# Patient Record
Sex: Female | Born: 1940
Health system: Southern US, Community
[De-identification: ages and names within clinical notes are randomized; demographics above are authoritative.]

## PROBLEM LIST (undated history)

## (undated) DIAGNOSIS — M810 Age-related osteoporosis without current pathological fracture: Secondary | ICD-10-CM

## (undated) DIAGNOSIS — Z923 Personal history of irradiation: Secondary | ICD-10-CM

## (undated) DIAGNOSIS — R011 Cardiac murmur, unspecified: Secondary | ICD-10-CM

## (undated) DIAGNOSIS — K219 Gastro-esophageal reflux disease without esophagitis: Secondary | ICD-10-CM

## (undated) DIAGNOSIS — T7840XA Allergy, unspecified, initial encounter: Secondary | ICD-10-CM

## (undated) DIAGNOSIS — I10 Essential (primary) hypertension: Secondary | ICD-10-CM

## (undated) DIAGNOSIS — M1990 Unspecified osteoarthritis, unspecified site: Secondary | ICD-10-CM

## (undated) DIAGNOSIS — H04123 Dry eye syndrome of bilateral lacrimal glands: Secondary | ICD-10-CM

## (undated) DIAGNOSIS — E785 Hyperlipidemia, unspecified: Secondary | ICD-10-CM

## (undated) DIAGNOSIS — C50919 Malignant neoplasm of unspecified site of unspecified female breast: Secondary | ICD-10-CM

## (undated) HISTORY — DX: Hyperlipidemia, unspecified: E78.5

## (undated) HISTORY — DX: Unspecified osteoarthritis, unspecified site: M19.90

## (undated) HISTORY — PX: EYE SURGERY: SHX253

## (undated) HISTORY — DX: Allergy, unspecified, initial encounter: T78.40XA

## (undated) HISTORY — DX: Dry eye syndrome of bilateral lacrimal glands: H04.123

## (undated) HISTORY — DX: Cardiac murmur, unspecified: R01.1

## (undated) HISTORY — DX: Age-related osteoporosis without current pathological fracture: M81.0

## (undated) HISTORY — DX: Essential (primary) hypertension: I10

## (undated) HISTORY — DX: Gastro-esophageal reflux disease without esophagitis: K21.9

---

## 1998-12-20 DIAGNOSIS — C50919 Malignant neoplasm of unspecified site of unspecified female breast: Secondary | ICD-10-CM

## 1998-12-20 DIAGNOSIS — Z923 Personal history of irradiation: Secondary | ICD-10-CM

## 1998-12-20 HISTORY — PX: BREAST LUMPECTOMY: SHX2

## 1998-12-20 HISTORY — PX: BREAST BIOPSY: SHX20

## 1998-12-20 HISTORY — DX: Malignant neoplasm of unspecified site of unspecified female breast: C50.919

## 1998-12-20 HISTORY — DX: Personal history of irradiation: Z92.3

## 2004-12-26 ENCOUNTER — Emergency Department: Payer: Self-pay | Admitting: Emergency Medicine

## 2005-07-05 LAB — HM COLONOSCOPY: HM Colonoscopy: NORMAL

## 2012-07-05 ENCOUNTER — Encounter: Payer: Self-pay | Admitting: Internal Medicine

## 2012-07-05 ENCOUNTER — Ambulatory Visit (INDEPENDENT_AMBULATORY_CARE_PROVIDER_SITE_OTHER): Payer: Medicare Other | Admitting: Internal Medicine

## 2012-07-05 VITALS — BP 140/90 | HR 76 | Temp 98.7°F | Ht 62.0 in | Wt 162.0 lb

## 2012-07-05 DIAGNOSIS — E785 Hyperlipidemia, unspecified: Secondary | ICD-10-CM | POA: Insufficient documentation

## 2012-07-05 DIAGNOSIS — F329 Major depressive disorder, single episode, unspecified: Secondary | ICD-10-CM

## 2012-07-05 DIAGNOSIS — Z853 Personal history of malignant neoplasm of breast: Secondary | ICD-10-CM | POA: Insufficient documentation

## 2012-07-05 DIAGNOSIS — M810 Age-related osteoporosis without current pathological fracture: Secondary | ICD-10-CM | POA: Insufficient documentation

## 2012-07-05 DIAGNOSIS — F32A Depression, unspecified: Secondary | ICD-10-CM | POA: Insufficient documentation

## 2012-07-05 DIAGNOSIS — F3289 Other specified depressive episodes: Secondary | ICD-10-CM

## 2012-07-05 LAB — COMPREHENSIVE METABOLIC PANEL
ALT: 27 U/L (ref 0–35)
Albumin: 4.3 g/dL (ref 3.5–5.2)
CO2: 29 mEq/L (ref 19–32)
Calcium: 9.6 mg/dL (ref 8.4–10.5)
Chloride: 103 mEq/L (ref 96–112)
GFR: 75.14 mL/min (ref >60.00)
Glucose, Bld: 98 mg/dL (ref 70–99)
Potassium: 3.9 mEq/L (ref 3.5–5.1)
Sodium: 139 mEq/L (ref 135–145)
Total Protein: 7.5 g/dL (ref 6.0–8.3)

## 2012-07-05 LAB — LIPID PANEL: Cholesterol: 207 mg/dL — ABNORMAL HIGH (ref 0–200)

## 2012-07-05 MED ORDER — SIMVASTATIN 20 MG PO TABS
20.0000 mg | ORAL_TABLET | Freq: Every evening | ORAL | Status: DC
Start: 2012-07-05 — End: 2013-07-16

## 2012-07-05 MED ORDER — VENLAFAXINE HCL ER 150 MG PO CP24: 150.0000 mg | ORAL_CAPSULE | Freq: Every day | ORAL | Status: DC

## 2012-07-05 NOTE — Assessment & Plan Note (Signed)
Will obtain reports on previous treatment at [facility]. Repeat Dexa 2014.

## 2012-07-05 NOTE — Progress Notes (Signed)
Subjective:    Patient ID: Stephanie Mora, female    DOB: [birth year], 71 y.o.   MRN: [id number]  HPI 71YO female with h/o right sided breast cancer in 2000, hyperlipidemia, osteoporosis, and depression presents to establish care. She reports she is generally feeling well. She has been exercising, participating in a weight bearing class at [facility] several times per week.  She eats a healthy diet.  She reports full compliance with her medications. In regards to depression, she reports good control of symptoms with use of Effexor, which was started after diagnosis of breast cancer.  She has regular follow up at [facility] for mammograms, which have recently been normal.  She also follows at [facility] for osteoporosis and is scheduled in 2014 for Dexa.  Outpatient Encounter Prescriptions as of 07/05/2012  Medication Sig Dispense Refill  . aspirin 81 MG tablet Take 81 mg by mouth daily.      . calcium carbonate (OS-CAL) 600 MG TABS Take 600 mg by mouth 2 (two) times daily with a meal.      . Cholecalciferol (VITAMIN D3) 2000 UNITS TABS Take 1 tablet by mouth daily.      . Multiple Vitamin (MULTIVITAMIN) tablet Take 1 tablet by mouth daily.      . simvastatin (ZOCOR) 20 MG tablet Take 1 tablet (20 mg total) by mouth every evening.  90 tablet  4  . venlafaxine XR (EFFEXOR-XR) 150 MG 24 hr capsule Take 1 capsule (150 mg total) by mouth daily.  90 capsule  4  . DISCONTD: simvastatin (ZOCOR) 20 MG tablet Take 20 mg by mouth every evening.      Marland Kitchen DISCONTD: venlafaxine XR (EFFEXOR-XR) 150 MG 24 hr capsule Take 150 mg by mouth daily.       BP 140/90  Pulse 76  Temp 98.7 F (37.1 C) (Oral)  Ht 5\' 2"  (1.575 m)  Wt 162 lb (73.483 kg)  BMI 29.63 kg/m2  SpO2 98%  Review of Systems  Constitutional: Negative for fever, chills, appetite change, fatigue and unexpected weight change.  HENT: Negative for ear pain, congestion, sore throat, trouble swallowing, neck pain, voice change and sinus pressure.   Eyes: Negative  for visual disturbance.  Respiratory: Negative for cough, shortness of breath, wheezing and stridor.   Cardiovascular: Negative for chest pain, palpitations and leg swelling.  Gastrointestinal: Negative for nausea, vomiting, abdominal pain, diarrhea, constipation, blood in stool, abdominal distention and anal bleeding.  Genitourinary: Negative for dysuria and flank pain.  Musculoskeletal: Negative for myalgias, arthralgias and gait problem.  Skin: Negative for color change and rash.  Neurological: Negative for dizziness and headaches.  Hematological: Negative for adenopathy. Does not bruise/bleed easily.  Psychiatric/Behavioral: Negative for suicidal ideas, disturbed wake/sleep cycle and dysphoric mood. The patient is not nervous/anxious.        Objective:   Physical Exam  Constitutional: She is oriented to person, place, and time. She appears well-developed and well-nourished. No distress.  HENT:  Head: Normocephalic and atraumatic.  Right Ear: External ear normal.  Left Ear: External ear normal.  Nose: Nose normal.  Mouth/Throat: Oropharynx is clear and moist. No oropharyngeal exudate.  Eyes: Conjunctivae are normal. Pupils are equal, round, and reactive to light. Right eye exhibits no discharge. Left eye exhibits no discharge. No scleral icterus.  Neck: Normal range of motion. Neck supple. No tracheal deviation present. No thyromegaly present.  Cardiovascular: Normal rate, regular rhythm, normal heart sounds and intact distal pulses.  Exam reveals no gallop and no friction  rub.   No murmur heard. Pulmonary/Chest: Effort normal and breath sounds normal. No respiratory distress. She has no wheezes. She has no rales. She exhibits no tenderness.  Musculoskeletal: Normal range of motion. She exhibits no edema and no tenderness.  Lymphadenopathy:    She has no cervical adenopathy.  Neurological: She is alert and oriented to person, place, and time. No cranial nerve deficit. She exhibits  normal muscle tone. Coordination normal.  Skin: Skin is warm and dry. No rash noted. She is not diaphoretic. No erythema. No pallor.  Psychiatric: She has a normal mood and affect. Her behavior is normal. Judgment and thought content normal.          Assessment & Plan:

## 2012-07-05 NOTE — Assessment & Plan Note (Signed)
Well controlled on Effexor. Will continue. Follow up 3 months.

## 2012-07-05 NOTE — Assessment & Plan Note (Signed)
Will get records on previous evaluation and management. 

## 2012-07-05 NOTE — Assessment & Plan Note (Signed)
Will check lipids and LFTs with labs today. Follow up 3 months for physical exam. Continue Simvastatin.

## 2013-04-20 ENCOUNTER — Encounter: Payer: Medicare Other | Admitting: Internal Medicine

## 2013-05-09 ENCOUNTER — Ambulatory Visit (INDEPENDENT_AMBULATORY_CARE_PROVIDER_SITE_OTHER): Payer: Medicare Other | Admitting: Internal Medicine

## 2013-05-09 ENCOUNTER — Encounter: Payer: Self-pay | Admitting: Internal Medicine

## 2013-05-09 VITALS — BP 160/98 | HR 78 | Temp 98.3°F | Ht 62.0 in | Wt 169.0 lb

## 2013-05-09 DIAGNOSIS — R319 Hematuria, unspecified: Secondary | ICD-10-CM

## 2013-05-09 DIAGNOSIS — M6530 Trigger finger, unspecified finger: Secondary | ICD-10-CM | POA: Insufficient documentation

## 2013-05-09 DIAGNOSIS — L608 Other nail disorders: Secondary | ICD-10-CM

## 2013-05-09 DIAGNOSIS — L603 Nail dystrophy: Secondary | ICD-10-CM | POA: Insufficient documentation

## 2013-05-09 DIAGNOSIS — R141 Gas pain: Secondary | ICD-10-CM

## 2013-05-09 DIAGNOSIS — R142 Eructation: Secondary | ICD-10-CM | POA: Insufficient documentation

## 2013-05-09 DIAGNOSIS — Z0000 Encounter for general adult medical examination without abnormal findings: Secondary | ICD-10-CM

## 2013-05-09 DIAGNOSIS — M810 Age-related osteoporosis without current pathological fracture: Secondary | ICD-10-CM

## 2013-05-09 DIAGNOSIS — R143 Flatulence: Secondary | ICD-10-CM

## 2013-05-09 LAB — POCT URINALYSIS DIPSTICK
Protein, UA: NEGATIVE
Spec Grav, UA: 1.025
Urobilinogen, UA: 0.2

## 2013-05-09 MED ORDER — FLUTICASONE PROPIONATE 50 MCG/ACT NA SUSP: 2.0000 | Freq: Every day | NASAL | Status: DC

## 2013-05-09 NOTE — Assessment & Plan Note (Signed)
Discussed new FDA recommendations about discontinuing Calcium supplements and getting adequate calcium through diet.

## 2013-05-09 NOTE — Assessment & Plan Note (Signed)
Symptoms and exam consistent with trigger finger. Discussed referral to orthopedics. Pt would like to hold off for now.

## 2013-05-09 NOTE — Assessment & Plan Note (Signed)
Trace blood noted in urine today, will send urine for culture.

## 2013-05-09 NOTE — Assessment & Plan Note (Signed)
Abdominal pain, distension and belching. Exam is normal. Will check H. Pylori with labs. Consider starting daily PPI if symptoms persistent. Also discussed potential referral for EGD if symptoms persistent.

## 2013-05-09 NOTE — Assessment & Plan Note (Signed)
Recent splitting of nails. Likely normal finding. Will, however check B12 with labs.

## 2013-05-09 NOTE — Assessment & Plan Note (Addendum)
General medical exam normal today including breast exam. PAP and pelvic exam deferred given normal 2012. Plan to repeat PAP 2017. Acute issues addressed as above. Appropriate screening performed. Will check labs including CMP, CBC, lipids, B12. Mammogram is UTD and scheduled yearly at [facility]. Bone density testing also scheduled at [facility] this year. Other health maintenance UTD. Immunizations UTD. Encouraged Mediterranean style diet and regular exercise with goal of 3 x per week.

## 2013-05-09 NOTE — Progress Notes (Signed)
Subjective:    Patient ID: Stephanie Mora, female    DOB: 24-Apr-1941, 72 y.o.   MRN: [id number]  HPI The patient is here for annual Medicare wellness examination and management of other chronic and acute problems.   The risk factors are reflected in the social history.  The roster of all physicians providing medical care to patient - is listed in the Snapshot section of the chart.  Activities of daily living:  The patient is 100% independent in all ADLs: dressing, toileting, feeding as well as independent mobility  Home safety : The patient has smoke detectors in the home. They wear seatbelts.  There are no firearms at home. There is no violence in the home.   There is no risks for hepatitis, STDs or HIV. There is no history of blood transfusion. They have no travel history to infectious disease endemic areas of the world.  The patient has seen their dentist in the last six month. Dentist - Dr. Marden Noble  They have seen their eye doctor in the last year. [facility] - Dr. Tresa Res No issues with hearing. They have deferred audiologic testing in the last year.   They do not  have excessive sun exposure. Discussed the need for sun protection: hats, long sleeves and use of sunscreen if there is significant sun exposure. No dermatologist.  Diet: the importance of a healthy diet is discussed. They do have a healthy diet. Low fat.  The benefits of regular aerobic exercise were discussed. 2x weekly, strength training.   Depression screen: there are no signs or vegative symptoms of depression- irritability, change in appetite, anhedonia, sadness/tearfullness.  Cognitive assessment: the patient manages all their financial and personal affairs and is actively engaged. They could relate day,date,year and events.   The following portions of the patient's history were reviewed and updated as appropriate: allergies, current medications, past family history, past medical history,  past surgical  history, past social history  and problem list.  Visual acuity was not assessed per patient preference since she has regular follow up with her ophthalmologist. Hearing and body mass index were assessed and reviewed.   During the course of the visit the patient was educated and counseled about appropriate screening and preventive services including : fall prevention , diabetes screening, nutrition counseling, colorectal cancer screening, and recommended immunizations.    Patient has several additional concerns today. First, she reports several week history of abdominal distention, epigastric abdominal pain, and intermittent belching. She denies nausea or vomiting. She has had upper GI in the past which has been unrevealing. She has not taken any medication for this. She denies any change in appetite or bowel habits.  Second, she is concerned about intermittent trigger finger particularly in her right middle finger. Sometimes, when working in her yard she develops contracture of her middle finger. She questions whether physical therapy or finger brace might be helpful.  She reports that she is scheduled to have repeat bone density testing performed at [facility] later this summer. She has been taking calcium and vitamin D supplements. She took Reclast the past.   Outpatient Encounter Prescriptions as of 05/09/2013  Medication Sig Dispense Refill  . aspirin 81 MG tablet Take 81 mg by mouth daily.      . Cholecalciferol (VITAMIN D3) 2000 UNITS TABS Take 1 tablet by mouth daily.      Marland Kitchen co-enzyme Q-10 30 MG capsule Take 30 mg by mouth 3 (three) times daily.      Marland Kitchen  fluticasone (FLONASE) 50 MCG/ACT nasal spray Place 2 sprays into the nose daily.  16 g  6  . Multiple Vitamin (MULTIVITAMIN) tablet Take 1 tablet by mouth daily.      Bertram Gala Glycol-Propyl Glycol (SYSTANE) 0.4-0.3 % SOLN Apply to eye.      . simvastatin (ZOCOR) 20 MG tablet Take 1 tablet (20 mg total) by mouth every evening.  90  tablet  4  . venlafaxine XR (EFFEXOR-XR) 150 MG 24 hr capsule Take 1 capsule (150 mg total) by mouth daily.  90 capsule  4   No facility-administered encounter medications on file as of 05/09/2013.   BP 160/98  Pulse 78  Temp(Src) 98.3 F (36.8 C) (Oral)  Ht 5\' 2"  (1.575 m)  Wt 169 lb (76.658 kg)  BMI 30.9 kg/m2  SpO2 97%  Review of Systems  Constitutional: Negative for fever, chills, appetite change, fatigue and unexpected weight change.  HENT: Negative for ear pain, congestion, sore throat, trouble swallowing, neck pain, voice change and sinus pressure.   Eyes: Negative for visual disturbance.  Respiratory: Negative for cough, shortness of breath, wheezing and stridor.   Cardiovascular: Negative for chest pain, palpitations and leg swelling.  Gastrointestinal: Positive for abdominal pain. Negative for nausea, vomiting, diarrhea, constipation, blood in stool, abdominal distention and anal bleeding.  Genitourinary: Negative for dysuria and flank pain.  Musculoskeletal: Positive for arthralgias. Negative for myalgias and gait problem.  Skin: Negative for color change and rash.  Neurological: Negative for dizziness and headaches.  Hematological: Negative for adenopathy. Does not bruise/bleed easily.  Psychiatric/Behavioral: Negative for suicidal ideas, sleep disturbance and dysphoric mood. The patient is not nervous/anxious.        Objective:   Physical Exam  Constitutional: She is oriented to person, place, and time. She appears well-developed and well-nourished. No distress.  HENT:  Head: Normocephalic and atraumatic.  Right Ear: External ear normal.  Left Ear: External ear normal.  Nose: Nose normal.  Mouth/Throat: Oropharynx is clear and moist. No oropharyngeal exudate.  Eyes: Conjunctivae are normal. Pupils are equal, round, and reactive to light. Right eye exhibits no discharge. Left eye exhibits no discharge. No scleral icterus.  Neck: Normal range of motion. Neck supple.  No tracheal deviation present. No thyromegaly present.  Cardiovascular: Normal rate, regular rhythm, normal heart sounds and intact distal pulses.  Exam reveals no gallop and no friction rub.   No murmur heard. Pulmonary/Chest: Effort normal and breath sounds normal. No accessory muscle usage. Not tachypneic. No respiratory distress. She has no decreased breath sounds. She has no wheezes. She has no rhonchi. She has no rales. She exhibits no tenderness. Right breast exhibits no inverted nipple, no mass, no nipple discharge, no skin change and no tenderness. Left breast exhibits no inverted nipple, no mass, no nipple discharge, no skin change and no tenderness. Breasts are symmetrical.    Abdominal: Soft. Bowel sounds are normal. She exhibits no distension and no mass. There is no tenderness. There is no rebound and no guarding.  Musculoskeletal: Normal range of motion. She exhibits no edema and no tenderness.  Lymphadenopathy:    She has no cervical adenopathy.  Neurological: She is alert and oriented to person, place, and time. No cranial nerve deficit. She exhibits normal muscle tone. Coordination normal.  Skin: Skin is warm and dry. No rash noted. She is not diaphoretic. No erythema. No pallor.  Psychiatric: She has a normal mood and affect. Her behavior is normal. Judgment and thought content normal.  Assessment & Plan:

## 2013-05-10 LAB — COMPREHENSIVE METABOLIC PANEL
Alkaline Phosphatase: 67 U/L (ref 39–117)
Creatinine, Ser: 0.8 mg/dL (ref 0.4–1.2)
GFR: 78.34 mL/min (ref >60.00)
Glucose, Bld: 87 mg/dL (ref 70–99)
Sodium: 136 mEq/L (ref 135–145)
Total Bilirubin: 0.4 mg/dL (ref 0.3–1.2)
Total Protein: 6.8 g/dL (ref 6.0–8.3)

## 2013-05-10 LAB — LIPID PANEL
Total CHOL/HDL Ratio: 3
Triglycerides: 262 mg/dL — ABNORMAL HIGH (ref 0.0–149.0)

## 2013-05-10 LAB — CBC WITH DIFFERENTIAL/PLATELET
Basophils Relative: 0.9 % (ref 0.0–3.0)
Eosinophils Relative: 3.9 % (ref 0.0–5.0)
HCT: 39.7 % (ref 36.0–46.0)
Lymphs Abs: 2.5 10*3/uL (ref 0.7–4.0)
MCV: 84.9 fl (ref 78.0–100.0)
Monocytes Absolute: 0.5 10*3/uL (ref 0.1–1.0)
RBC: 4.67 Mil/uL (ref 3.87–5.11)
WBC: 8.3 10*3/uL (ref 4.5–10.5)

## 2013-05-10 LAB — VITAMIN B12: Vitamin B-12: 762 pg/mL (ref 211–911)

## 2013-05-11 LAB — URINE CULTURE: Colony Count: NO GROWTH

## 2013-07-16 ENCOUNTER — Other Ambulatory Visit: Payer: Self-pay | Admitting: Internal Medicine

## 2013-07-23 LAB — HM MAMMOGRAPHY

## 2013-09-24 ENCOUNTER — Other Ambulatory Visit: Payer: Self-pay | Admitting: Internal Medicine

## 2013-09-24 NOTE — Telephone Encounter (Signed)
Eprescribed.

## 2013-11-12 ENCOUNTER — Telehealth: Payer: Self-pay | Admitting: Internal Medicine

## 2013-11-12 ENCOUNTER — Ambulatory Visit (INDEPENDENT_AMBULATORY_CARE_PROVIDER_SITE_OTHER): Payer: Medicare Other | Admitting: Internal Medicine

## 2013-11-12 ENCOUNTER — Ambulatory Visit: Payer: Self-pay | Admitting: Internal Medicine

## 2013-11-12 ENCOUNTER — Encounter: Payer: Self-pay | Admitting: Internal Medicine

## 2013-11-12 VITALS — BP 136/76 | HR 76 | Temp 97.9°F | Resp 12 | Ht 62.0 in | Wt 169.5 lb

## 2013-11-12 DIAGNOSIS — R1032 Left lower quadrant pain: Secondary | ICD-10-CM

## 2013-11-12 DIAGNOSIS — E785 Hyperlipidemia, unspecified: Secondary | ICD-10-CM

## 2013-11-12 LAB — POCT URINALYSIS DIPSTICK
Bilirubin, UA: NEGATIVE
Glucose, UA: NEGATIVE
Ketones, UA: NEGATIVE
Spec Grav, UA: 1.025
Urobilinogen, UA: 0.2

## 2013-11-12 LAB — COMPREHENSIVE METABOLIC PANEL
AST: 37 U/L (ref 0–37)
Albumin: 3.9 g/dL (ref 3.5–5.2)
Alkaline Phosphatase: 67 U/L (ref 39–117)
BUN: 15 mg/dL (ref 6–23)
CO2: 32 mEq/L (ref 19–32)
Creatinine, Ser: 0.8 mg/dL (ref 0.4–1.2)
Glucose, Bld: 84 mg/dL (ref 70–99)
Potassium: 4 mEq/L (ref 3.5–5.1)
Total Bilirubin: 0.3 mg/dL (ref 0.3–1.2)

## 2013-11-12 NOTE — Telephone Encounter (Signed)
CT abdomen normal. No findings to explain abd pain. Discussed with pt. She will monitor symptoms and call if persistent abdominal pain or worsening symptoms.

## 2013-11-12 NOTE — Assessment & Plan Note (Signed)
LFTs normal on labs today. Continue Simvastatin.

## 2013-11-12 NOTE — Progress Notes (Signed)
Pre visit review using our clinic review tool, if applicable. No additional management support is needed unless otherwise documented below in the visit note. 

## 2013-11-12 NOTE — Progress Notes (Signed)
Subjective:    Patient ID: Stephanie Mora, female    DOB: [birth year], 72 y.o.   MRN: [id number]  HPI 72YO female with h/o HL presents for follow up. Her primary concern today is 3 month h/o left lower abdominal pain. Pain is intermittent, aching. It does not radiate. It is not associated with change in bowel habits. She denies any urinary symptoms, fever, chills. The pain is occasionally worsened by movement such as walking, but not always. For example, yesterday she sat and read a book all day and had abdominal pain. She has not taken any medication for pain.  Outpatient Encounter Prescriptions as of 11/12/2013  Medication Sig  . aspirin 81 MG tablet Take 81 mg by mouth daily.  . Cholecalciferol (VITAMIN D3) 2000 UNITS TABS Take 1 tablet by mouth daily.  Marland Kitchen co-enzyme Q-10 30 MG capsule Take 30 mg by mouth 3 (three) times daily.  . fluticasone (FLONASE) 50 MCG/ACT nasal spray Place 2 sprays into the nose daily.  . Multiple Vitamin (MULTIVITAMIN) tablet Take 1 tablet by mouth daily.  Bertram Gala Glycol-Propyl Glycol (SYSTANE) 0.4-0.3 % SOLN Apply to eye.  . simvastatin (ZOCOR) 20 MG tablet TAKE 1 TABLET (20 MG TOTAL) BY MOUTH EVERY EVENING.  Marland Kitchen venlafaxine XR (EFFEXOR-XR) 150 MG 24 hr capsule TAKE 1 CAPSULE (150 MG TOTAL) BY MOUTH DAILY.   BP 136/76  Pulse 76  Temp(Src) 97.9 F (36.6 C) (Oral)  Resp 12  Ht 5\' 2"  (1.575 m)  Wt 169 lb 8 oz (76.885 kg)  BMI 30.99 kg/m2  SpO2 98%  Review of Systems  Constitutional: Negative for fever, chills, appetite change, fatigue and unexpected weight change.  HENT: Negative for congestion, ear pain, sinus pressure, sore throat, trouble swallowing and voice change.   Eyes: Negative for visual disturbance.  Respiratory: Negative for cough, shortness of breath, wheezing and stridor.   Cardiovascular: Negative for chest pain, palpitations and leg swelling.  Gastrointestinal: Positive for abdominal pain. Negative for nausea, vomiting, diarrhea,  constipation, blood in stool, abdominal distention and anal bleeding.  Genitourinary: Positive for pelvic pain. Negative for dysuria, urgency, hematuria and flank pain.  Musculoskeletal: Negative for arthralgias, gait problem, myalgias and neck pain.  Skin: Negative for color change and rash.  Neurological: Negative for dizziness and headaches.  Hematological: Negative for adenopathy. Does not bruise/bleed easily.  Psychiatric/Behavioral: Negative for suicidal ideas, sleep disturbance and dysphoric mood. The patient is not nervous/anxious.        Objective:   Physical Exam  Constitutional: She is oriented to person, place, and time. She appears well-developed and well-nourished. No distress.  HENT:  Head: Normocephalic and atraumatic.  Right Ear: External ear normal.  Left Ear: External ear normal.  Nose: Nose normal.  Mouth/Throat: Oropharynx is clear and moist. No oropharyngeal exudate.  Eyes: Conjunctivae are normal. Pupils are equal, round, and reactive to light. Right eye exhibits no discharge. Left eye exhibits no discharge. No scleral icterus.  Neck: Normal range of motion. Neck supple. No tracheal deviation present. No thyromegaly present.  Cardiovascular: Normal rate, regular rhythm, normal heart sounds and intact distal pulses.  Exam reveals no gallop and no friction rub.   No murmur heard. Pulmonary/Chest: Effort normal and breath sounds normal. No accessory muscle usage. Not tachypneic. No respiratory distress. She has no decreased breath sounds. She has no wheezes. She has no rhonchi. She has no rales. She exhibits no tenderness.  Abdominal: Soft. Bowel sounds are normal. She exhibits no distension and no mass. There is  tenderness (left lower quad). There is no rebound and no guarding.  Musculoskeletal: Normal range of motion. She exhibits no edema and no tenderness.  Lymphadenopathy:    She has no cervical adenopathy.  Neurological: She is alert and oriented to person,  place, and time. No cranial nerve deficit. She exhibits normal muscle tone. Coordination normal.  Skin: Skin is warm and dry. No rash noted. She is not diaphoretic. No erythema. No pallor.  Psychiatric: She has a normal mood and affect. Her behavior is normal. Judgment and thought content normal.          Assessment & Plan:

## 2013-11-12 NOTE — Assessment & Plan Note (Signed)
3 month h/o left lower abdominal pain. Exam is normal except for mild tenderness with palpation. We discussed potential causes including diverticulitis, hernia, ovarian pathology. Will get CT abdomen for further evaluation.

## 2013-11-27 ENCOUNTER — Encounter: Payer: Self-pay | Admitting: Internal Medicine

## 2013-12-21 ENCOUNTER — Other Ambulatory Visit: Payer: Self-pay | Admitting: Internal Medicine

## 2014-02-18 ENCOUNTER — Other Ambulatory Visit: Payer: Self-pay | Admitting: Internal Medicine

## 2014-03-22 ENCOUNTER — Other Ambulatory Visit: Payer: Self-pay | Admitting: Internal Medicine

## 2014-03-25 ENCOUNTER — Encounter: Payer: Self-pay | Admitting: *Deleted

## 2014-04-17 ENCOUNTER — Telehealth: Payer: Self-pay | Admitting: Internal Medicine

## 2014-04-17 DIAGNOSIS — Z1211 Encounter for screening for malignant neoplasm of colon: Secondary | ICD-10-CM

## 2014-04-17 NOTE — Telephone Encounter (Signed)
Left vm asking for appt with Dr. Vira Agar for colonoscopy.  Will be out of town Friday 5/1 and Monday 5/4.

## 2014-04-18 NOTE — Telephone Encounter (Signed)
Spoke with pt, received a letter from [facility] that is was time for her colonoscopy. The doctor she saw last is not there anymore and she was advised by [facility] that she would need a referral from her PCP. She would like to see Dr. Tiffany Kocher. Notified that Dr. Gilford Rile is out of the office until Monday, but that she will place the referral and our patient care coordinator will contact her once an appointment has been scheduled. Patient states she is in no hurry, not urgent.

## 2014-05-21 ENCOUNTER — Encounter: Payer: Medicare Other | Admitting: Internal Medicine

## 2014-05-23 ENCOUNTER — Ambulatory Visit (INDEPENDENT_AMBULATORY_CARE_PROVIDER_SITE_OTHER): Payer: BC Managed Care – PPO | Admitting: Internal Medicine

## 2014-05-23 ENCOUNTER — Encounter: Payer: Self-pay | Admitting: Internal Medicine

## 2014-05-23 ENCOUNTER — Encounter: Payer: Self-pay | Admitting: *Deleted

## 2014-05-23 VITALS — BP 112/68 | HR 90 | Temp 98.1°F | Resp 14 | Ht 61.75 in | Wt 167.5 lb

## 2014-05-23 DIAGNOSIS — Z23 Encounter for immunization: Secondary | ICD-10-CM

## 2014-05-23 DIAGNOSIS — M25511 Pain in right shoulder: Secondary | ICD-10-CM | POA: Insufficient documentation

## 2014-05-23 DIAGNOSIS — M25519 Pain in unspecified shoulder: Secondary | ICD-10-CM

## 2014-05-23 DIAGNOSIS — M810 Age-related osteoporosis without current pathological fracture: Secondary | ICD-10-CM | POA: Insufficient documentation

## 2014-05-23 DIAGNOSIS — Z0000 Encounter for general adult medical examination without abnormal findings: Secondary | ICD-10-CM

## 2014-05-23 DIAGNOSIS — E669 Obesity, unspecified: Secondary | ICD-10-CM | POA: Insufficient documentation

## 2014-05-23 DIAGNOSIS — R9431 Abnormal electrocardiogram [ECG] [EKG]: Secondary | ICD-10-CM

## 2014-05-23 DIAGNOSIS — E785 Hyperlipidemia, unspecified: Secondary | ICD-10-CM

## 2014-05-23 LAB — LIPID PANEL
CHOL/HDL RATIO: 3
CHOLESTEROL: 185 mg/dL (ref 0–200)
HDL: 64.8 mg/dL (ref >39.00)
LDL Cholesterol: 78 mg/dL (ref 0–99)
NONHDL: 120.2
Triglycerides: 213 mg/dL — ABNORMAL HIGH (ref 0.0–149.0)
VLDL: 42.6 mg/dL — AB (ref 0.0–40.0)

## 2014-05-23 LAB — CBC WITH DIFFERENTIAL/PLATELET
BASOS ABS: 0.1 10*3/uL (ref 0.0–0.1)
Basophils Relative: 0.6 % (ref 0.0–3.0)
EOS PCT: 2.2 % (ref 0.0–5.0)
Eosinophils Absolute: 0.2 10*3/uL (ref 0.0–0.7)
HCT: 40.1 % (ref 36.0–46.0)
Hemoglobin: 13.3 g/dL (ref 12.0–15.0)
Lymphocytes Relative: 20.4 % (ref 12.0–46.0)
Lymphs Abs: 1.7 10*3/uL (ref 0.7–4.0)
MCHC: 33.1 g/dL (ref 30.0–36.0)
MCV: 86.1 fl (ref 78.0–100.0)
MONO ABS: 0.7 10*3/uL (ref 0.1–1.0)
Monocytes Relative: 7.8 % (ref 3.0–12.0)
NEUTROS PCT: 69 % (ref 43.0–77.0)
Neutro Abs: 5.8 10*3/uL (ref 1.4–7.7)
PLATELETS: 254 10*3/uL (ref 150.0–400.0)
RBC: 4.66 Mil/uL (ref 3.87–5.11)
RDW: 14.3 % (ref 11.5–15.5)
WBC: 8.4 10*3/uL (ref 4.0–10.5)

## 2014-05-23 LAB — COMPREHENSIVE METABOLIC PANEL
ALBUMIN: 3.9 g/dL (ref 3.5–5.2)
ALK PHOS: 63 U/L (ref 39–117)
ALT: 31 U/L (ref 0–35)
AST: 32 U/L (ref 0–37)
BUN: 15 mg/dL (ref 6–23)
CO2: 28 mEq/L (ref 19–32)
Calcium: 9.4 mg/dL (ref 8.4–10.5)
Chloride: 104 mEq/L (ref 96–112)
Creatinine, Ser: 0.7 mg/dL (ref 0.4–1.2)
GFR: 87.19 mL/min (ref >60.00)
GLUCOSE: 91 mg/dL (ref 70–99)
POTASSIUM: 3.9 meq/L (ref 3.5–5.1)
SODIUM: 137 meq/L (ref 135–145)
TOTAL PROTEIN: 6.8 g/dL (ref 6.0–8.3)
Total Bilirubin: 0.3 mg/dL (ref 0.2–1.2)

## 2014-05-23 LAB — VITAMIN B12: Vitamin B-12: 635 pg/mL (ref 211–911)

## 2014-05-23 LAB — VITAMIN D 25 HYDROXY (VIT D DEFICIENCY, FRACTURES): VITD: 38.39 ng/mL

## 2014-05-23 LAB — MICROALBUMIN / CREATININE URINE RATIO
CREATININE, U: 141.5 mg/dL
MICROALB UR: 2 mg/dL — AB (ref 0.0–1.9)
Microalb Creat Ratio: 1.4 mg/g (ref 0.0–30.0)

## 2014-05-23 NOTE — Progress Notes (Signed)
The patient is here for annual Medicare Wellness Examination and management of other chronic and acute problems.   The risk factors are reflected in the history.  The roster of all physicians providing medical care to patient - is listed in the Snapshot section of the chart.  Activities of daily living:   The patient is 100% independent in all ADLs: dressing, toileting, feeding as well as independent mobility. Patient lives with husband in home, two story home. One cat.  Home safety :  The patient has smoke detectors in the home.  They wear seatbelts in their car. There are no firearms at home.  There is no violence in the home. They feel safe where they live.  Infectious Risks: There is no risks for hepatitis, STDs or HIV.  There is no  history of blood transfusion.  They have no travel history to infectious disease endemic areas of the world.  Additional Health Care Providers: The patient has seen their dentist in the last six months. Dentist - Dr. Lynelle Smoke They have seen their eye doctor in the last year. Opthalmologist - [facility] They deny hearing issues. They have deferred audiologic testing in the last year.   They do not  have excessive sun exposure. Discussed the need for sun protection: hats,long sleeves and use of sunscreen if there is significant sun exposure.  Dermatologist - previously, Dr. Kellie Moor  Diet: the importance of a healthy diet is discussed. They do have a healthy diet.  The benefits of regular aerobic exercise were discussed. Patient exercises by going to gym twice weekly, strength training.  Depression screen: there are no signs or vegative symptoms of depression- irritability, change in appetite, anhedonia, sadness/tearfullness.  Cognitive assessment: the patient manages all their financial and personal affairs and is actively engaged.   HCPOA - son and daughter  The following portions of the patient's history were reviewed and updated as  appropriate: allergies, current medications, past family history, past medical history,  past surgical history, past social history and problem list.  Visual acuity was not assessed per patient preference as they have regular follow up with their ophthalmologist. Hearing and body mass index were assessed and reviewed.   During the course of the visit the patient was educated and counseled about appropriate screening and preventive services including : fall prevention , diabetes screening, nutrition counseling, colorectal cancer screening, and recommended immunizations.    BP recently has been running closer to 140/70.  Shoulder pain - 3-4 weeks ago developed pain in anterior right shoulder after lifting weights. Described as burning pain that does not radiate. Continues to have pain with abduction of right arm. Limiting weight lifting.  Review of Systems  Constitutional: Negative for fever, chills, appetite change, fatigue and unexpected weight change.  HENT: Negative for congestion, ear pain, sinus pressure, sore throat, trouble swallowing and voice change.   Eyes: Negative for visual disturbance.  Respiratory: Negative for cough, shortness of breath, wheezing and stridor.   Cardiovascular: Negative for chest pain, palpitations and leg swelling.  Gastrointestinal: Negative for nausea, vomiting, abdominal pain, diarrhea, constipation, blood in stool, abdominal distention and anal bleeding.  Genitourinary: Negative for dysuria and flank pain.  Musculoskeletal: Positive for arthralgias and myalgias. Negative for gait problem and neck pain.  Skin: Negative for color change and rash.  Neurological: Negative for dizziness and headaches.  Hematological: Negative for adenopathy. Does not bruise/bleed easily.  Psychiatric/Behavioral: Negative for suicidal ideas, sleep disturbance and dysphoric mood. The patient is not nervous/anxious.  Objective:    BP 112/68  Pulse 90  Temp(Src) 98.1 F  (36.7 C) (Oral)  Resp 14  Ht 5' 1.75" (1.568 m)  Wt 167 lb 8 oz (75.978 kg)  BMI 30.90 kg/m2  SpO2 97% Physical Exam  Constitutional: She is oriented to person, place, and time. She appears well-developed and well-nourished. No distress.  HENT:  Head: Normocephalic and atraumatic.  Right Ear: External ear normal.  Left Ear: External ear normal.  Nose: Nose normal.  Mouth/Throat: Oropharynx is clear and moist. No oropharyngeal exudate.  Eyes: Conjunctivae are normal. Pupils are equal, round, and reactive to light. Right eye exhibits no discharge. Left eye exhibits no discharge. No scleral icterus.  Neck: Normal range of motion. Neck supple. No tracheal deviation present. No thyromegaly present.  Cardiovascular: Normal rate, regular rhythm, normal heart sounds and intact distal pulses.  Exam reveals no gallop and no friction rub.   No murmur heard. Pulmonary/Chest: Effort normal and breath sounds normal. No accessory muscle usage. Not tachypneic. No respiratory distress. She has no decreased breath sounds. She has no wheezes. She has no rales. She exhibits no tenderness. Right breast exhibits no inverted nipple, no mass, no nipple discharge, no skin change and no tenderness. Left breast exhibits no inverted nipple, no mass, no nipple discharge, no skin change and no tenderness. Breasts are symmetrical.  Abdominal: Soft. Bowel sounds are normal. She exhibits no distension and no mass. There is no tenderness. There is no rebound and no guarding.  Musculoskeletal: She exhibits no edema.       Right shoulder: She exhibits decreased range of motion (limited by pain), tenderness, pain and decreased strength (limited abduction 2/2 pain). She exhibits no bony tenderness.  Lymphadenopathy:    She has no cervical adenopathy.  Neurological: She is alert and oriented to person, place, and time. No cranial nerve deficit. She exhibits normal muscle tone. Coordination normal.  Skin: Skin is warm and dry. No  rash noted. She is not diaphoretic. No erythema. No pallor.  Psychiatric: She has a normal mood and affect. Her behavior is normal. Judgment and thought content normal.          Assessment & Plan:   Problem List Items Addressed This Visit     Unprioritized   Medicare annual wellness visit, subsequent - Primary     General medical exam normal today including breast exam. PAP and pelvic exam deferred given normal 2012.  Acute issues addressed as above. Appropriate screening performed. Will check labs including CMP, CBC, lipids, B12. Mammogram is UTD and scheduled yearly at [facility]. Bone density testing will also be scheduled at [place] this year. Other health maintenance UTD. Immunizations UTD. Encouraged Mediterranean style diet and regular exercise with goal of 68min 3 x per week.      Relevant Orders      CBC with Differential      Microalbumin / creatinine urine ratio      POCT Urinalysis Dipstick      B12   Obesity (BMI 30-39.9)      Wt Readings from Last 3 Encounters:  05/23/14 167 lb 8 oz (75.978 kg)  11/12/13 169 lb 8 oz (76.885 kg)  05/09/13 169 lb (76.658 kg)  Body mass index is 30.9 kg/(m^2).  Encouraged healthy diet and regular exercise with goal of weight loss.    Relevant Medications      magnesium oxide (MAG-OX) 400 MG tablet   Osteoporosis, unspecified     Bone density testing ordered. Vit  D level ordered.    Relevant Orders      DG Bone Density      Vit D  25 hydroxy (rtn osteoporosis monitoring)   Other and unspecified hyperlipidemia     Will check lipids and LFTs with labs. Continue Simvastatin.    Relevant Orders      Comprehensive metabolic panel      Lipid panel      EKG 12-Lead (Completed)   Right shoulder pain     Right shoulder pain after injury. Will set up Sports Medicine evaluation. Suspect rotator cuff tear. Question if [place] might be helpful for evaluation. Encouraged use of Ibuprofen and ice to shoulder.    Relevant Orders      Ambulatory  referral to Sports Medicine    Other Visit Diagnoses   Need for vaccination with 13-polyvalent pneumococcal conjugate vaccine        Relevant Orders       Pneumococcal conjugate vaccine 13-valent (Completed)    Nonspecific abnormal electrocardiogram (ECG) (EKG)        Relevant Orders       Ambulatory referral to Cardiology        Return in about 6 months (around 11/22/2014).

## 2014-05-23 NOTE — Assessment & Plan Note (Signed)
Right shoulder pain after injury. Will set up Sports Medicine evaluation. Suspect rotator cuff tear. Question if [place] might be helpful for evaluation. Encouraged use of Ibuprofen and ice to shoulder.

## 2014-05-23 NOTE — Assessment & Plan Note (Signed)
Bone density testing ordered. Vit D level ordered.

## 2014-05-23 NOTE — Assessment & Plan Note (Signed)
Will check lipids and LFTs with labs. Continue Simvastatin. 

## 2014-05-23 NOTE — Progress Notes (Signed)
Pre visit review using our clinic review tool, if applicable. No additional management support is needed unless otherwise documented below in the visit note. 

## 2014-05-23 NOTE — Progress Notes (Signed)
Notified pt. 

## 2014-05-23 NOTE — Patient Instructions (Signed)
Start Ibuprofen 800mg  up to three times daily. Ice right shoulder 2-3 times daily. We will set up evaluation with sports medicine.  Consider Mediterranean style diet Exercise 64min 3 x per week.

## 2014-05-23 NOTE — Assessment & Plan Note (Signed)
Wt Readings from Last 3 Encounters:  05/23/14 167 lb 8 oz (75.978 kg)  11/12/13 169 lb 8 oz (76.885 kg)  05/09/13 169 lb (76.658 kg)  Body mass index is 30.9 kg/(m^2).  Encouraged healthy diet and regular exercise with goal of weight loss.

## 2014-05-23 NOTE — Assessment & Plan Note (Signed)
General medical exam normal today including breast exam. PAP and pelvic exam deferred given normal 2012.  Acute issues addressed as above. Appropriate screening performed. Will check labs including CMP, CBC, lipids, B12. Mammogram is UTD and scheduled yearly at [facility]. Bone density testing will also be scheduled at [place] this year. Other health maintenance UTD. Immunizations UTD. Encouraged Mediterranean style diet and regular exercise with goal of 21min 3 x per week.

## 2014-05-28 ENCOUNTER — Ambulatory Visit (INDEPENDENT_AMBULATORY_CARE_PROVIDER_SITE_OTHER): Payer: BC Managed Care – PPO | Admitting: Cardiovascular Disease

## 2014-05-28 ENCOUNTER — Encounter: Payer: Self-pay | Admitting: Cardiovascular Disease

## 2014-05-28 VITALS — BP 148/90 | HR 76 | Ht 62.0 in | Wt 168.0 lb

## 2014-05-28 DIAGNOSIS — R0602 Shortness of breath: Secondary | ICD-10-CM

## 2014-05-28 DIAGNOSIS — R9431 Abnormal electrocardiogram [ECG] [EKG]: Secondary | ICD-10-CM

## 2014-05-28 LAB — EKG 12-LEAD

## 2014-05-28 NOTE — Progress Notes (Signed)
Primary care physician: Dr. Gilford Rile  HPI  This is a 73 year old female who was referred for evaluation of an abnormal ECG which showed old inferior infarct. She has no previous cardiac history. She has no history of diabetes or hypertension. She has known history of hyperlipidemia treated with simvastatin. There is family history of coronary artery disease. Father had myocardial infarction at the age of 73 and ultimately died of heart disease at the age of 84. She is a lifelong nonsmoker and overall follows a healthy eating habits and exercises regularly at the gym. She was noted recently to have an abnormal EKG with possible old inferior infarct. She complains of mild exertional dyspnea without chest discomfort.  No Known Allergies   Current Outpatient Prescriptions on File Prior to Visit  Medication Sig Dispense Refill  . aspirin 81 MG tablet Take 81 mg by mouth daily.      . Cholecalciferol (VITAMIN D3) 2000 UNITS TABS Take 1 tablet by mouth daily.      . Coenzyme Q10 (COQ10) 100 MG CAPS Take 1 capsule by mouth daily.      . fluticasone (FLONASE) 50 MCG/ACT nasal spray Place 2 sprays into the nose as needed.      . magnesium oxide (MAG-OX) 400 MG tablet Take 400 mg by mouth daily.      . Multiple Vitamin (MULTIVITAMIN) tablet Take 1 tablet by mouth daily.      Vladimir Faster Glycol-Propyl Glycol (SYSTANE) 0.4-0.3 % SOLN Apply to eye.      . simvastatin (ZOCOR) 20 MG tablet TAKE 1 TABLET (20 MG TOTAL) BY MOUTH EVERY EVENING.  90 tablet  1  . venlafaxine XR (EFFEXOR-XR) 150 MG 24 hr capsule TAKE 1 CAPSULE (150 MG TOTAL) BY MOUTH DAILY.  90 capsule  0   No current facility-administered medications on file prior to visit.     Past Medical History  Diagnosis Date  . Osteoporosis     reclast treatment in past  . Hyperlipidemia   . Bilateral dry eyes   . Heart murmur     as child  . Cancer 2000    breast right, lumpectomy and XRT     Past Surgical History  Procedure Laterality Date    . Breast lumpectomy       Family History  Problem Relation Age of Onset  . Cancer Mother   . Heart disease Father   . Heart attack Father 6  . Cancer Maternal Aunt      History   Social History  . Marital Status: Married    Spouse Name: N/A    Number of Children: N/A  . Years of Education: N/A   Occupational History  . Not on file.   Social History Main Topics  . Smoking status: Former Research scientist (life sciences)  . Smokeless tobacco: Not on file  . Alcohol Use: Yes     Comment: occasional  . Drug Use: No  . Sexual Activity: Not on file   Other Topics Concern  . Not on file   Social History Narrative   Lives in [place] with husband.      Diet - healthy diet   Exercise - [employer] weight training     ROS A 10 point review of system was performed. It is negative other than that mentioned in the history of present illness.   PHYSICAL EXAM   BP 148/90  Pulse 76  Ht 5\' 2"  (1.575 m)  Wt 168 lb (76.204 kg)  BMI  30.72 kg/m2 Constitutional: She is oriented to person, place, and time. She appears well-developed and well-nourished. No distress.  HENT: No nasal discharge.  Head: Normocephalic and atraumatic.  Eyes: Pupils are equal and round. No discharge.  Neck: Normal range of motion. Neck supple. No JVD present. No thyromegaly present.  Cardiovascular: Normal rate, regular rhythm, normal heart sounds. Exam reveals no gallop and no friction rub. No murmur heard.  Pulmonary/Chest: Effort normal and breath sounds normal. No stridor. No respiratory distress. She has no wheezes. She has no rales. She exhibits no tenderness.  Abdominal: Soft. Bowel sounds are normal. She exhibits no distension. There is no tenderness. There is no rebound and no guarding.  Musculoskeletal: Normal range of motion. She exhibits no edema and no tenderness.  Neurological: She is alert and oriented to person, place, and time. Coordination normal.  Skin: Skin is warm and dry. No rash noted. She is not  diaphoretic. No erythema. No pallor.  Psychiatric: She has a normal mood and affect. Her behavior is normal. Judgment and thought content normal.     EKG: Normal sinus rhythm with no significant ST or T wave changes.   ASSESSMENT AND PLAN

## 2014-05-28 NOTE — Patient Instructions (Addendum)
Your physician has requested that you have a stress echocardiogram. For further information please visit [email or web address]. Please follow instruction sheet as given. 1. You can eat  2. You can take meds 3. Wear "gym" cloths. Easy to get off and on.  4. No lotion or perfume on chest   Your physician recommends that you schedule a follow-up appointment in:  As needed

## 2014-05-29 ENCOUNTER — Ambulatory Visit: Payer: Medicare Other | Admitting: Family Medicine

## 2014-05-31 ENCOUNTER — Encounter: Payer: Self-pay | Admitting: Cardiovascular Disease

## 2014-05-31 DIAGNOSIS — R9431 Abnormal electrocardiogram [ECG] [EKG]: Secondary | ICD-10-CM | POA: Insufficient documentation

## 2014-05-31 NOTE — Assessment & Plan Note (Signed)
The patient's recent EKG showed possible old inferior infarct with Q waves in leads 3 and aVF. This was also the same pattern in 2014. The EKG from today shows only an isolated Q wave in lead 3 which usually does not represent previous myocardial infarction but rather a normal variant. Symptoms include mild exertional dyspnea without chest discomfort. I recommend evaluating this with a stress echocardiogram. If stress test is normal, no further cardiac workup is recommended. Continue treatment of risk factors.

## 2014-06-06 ENCOUNTER — Ambulatory Visit (INDEPENDENT_AMBULATORY_CARE_PROVIDER_SITE_OTHER): Payer: BC Managed Care – PPO | Admitting: Family Medicine

## 2014-06-06 ENCOUNTER — Encounter: Payer: Self-pay | Admitting: Family Medicine

## 2014-06-06 ENCOUNTER — Other Ambulatory Visit (INDEPENDENT_AMBULATORY_CARE_PROVIDER_SITE_OTHER): Payer: BC Managed Care – PPO

## 2014-06-06 VITALS — BP 144/82 | HR 80 | Ht 62.0 in | Wt 166.0 lb

## 2014-06-06 DIAGNOSIS — M25511 Pain in right shoulder: Secondary | ICD-10-CM

## 2014-06-06 DIAGNOSIS — M7550 Bursitis of unspecified shoulder: Secondary | ICD-10-CM | POA: Insufficient documentation

## 2014-06-06 DIAGNOSIS — M25519 Pain in unspecified shoulder: Secondary | ICD-10-CM

## 2014-06-06 DIAGNOSIS — IMO0002 Reserved for concepts with insufficient information to code with codable children: Secondary | ICD-10-CM

## 2014-06-06 DIAGNOSIS — M75100 Unspecified rotator cuff tear or rupture of unspecified shoulder, not specified as traumatic: Secondary | ICD-10-CM

## 2014-06-06 NOTE — Progress Notes (Signed)
[facility] [address], [place] [address] Phone: [phone] Subjective:    I'm seeing this patient by the request  of:  Rica Mast, MD   CC: Right shoulder pain  [id number] Stephanie Mora is a 73 y.o. female coming in with complaint of right shoulder pain. Patient states that she has had this pain for approximately 4-5 months but seems to get worse over the course last couple weeks. Patient states that she's been exercising more and doing a lot of weightlifting. Patient states that it does hurt with overhead activity or reaching behind her back. Patient denies any radiation down the arm or any numbness or tingling. Patient denies any significant weakness. Patient states though if she rolls over on this shoulder at night she can have some discomfort. He denies any neck pain it seems to be associated with this. Patient has tried some over-the-counter medication with minimal improvement. Describes the pain as more 7/10 in severity when very bad. Patient would like to continue exercising to stay in shape.     Past medical history, social, surgical and family history all reviewed in electronic medical record.   Review of Systems: No headache, visual changes, nausea, vomiting, diarrhea, constipation, dizziness, abdominal pain, skin rash, fevers, chills, night sweats, weight loss, swollen lymph nodes, body aches, joint swelling, muscle aches, chest pain, shortness of breath, mood changes.   Objective Blood pressure 144/82, pulse 80, height 5\' 2"  (1.575 m), weight 166 lb (75.297 kg), SpO2 97.00%.  General: No apparent distress alert and oriented x3 mood and affect normal, dressed appropriately.  HEENT: Pupils equal, extraocular movements intact  Respiratory: Patient's speak in full sentences and does not appear short of breath  Cardiovascular: No lower extremity edema, non tender, no erythema  Skin: Warm dry intact with no signs of infection  or rash on extremities or on axial skeleton.  Abdomen: Soft nontender  Neuro: Cranial nerves II through XII are intact, neurovascularly intact in all extremities with 2+ DTRs and 2+ pulses.  Lymph: No lymphadenopathy of posterior or anterior cervical chain or axillae bilaterally.  Gait normal with good balance and coordination.  MSK:  Non tender with full range of motion and good stability and symmetric strength and tone of  elbows, wrist, hip, knee and ankles bilaterally.  Shoulder: Right Inspection reveals no abnormalities, atrophy or asymmetry. Palpation is normal with no tenderness over AC joint or bicipital groove. ROM is full in all planes passively. Rotator cuff strength normal throughout. signs of impingement with positive Neer and Hawkin's tests, but negative empty can sign. Speeds and Yergason's tests normal. No labral pathology noted with negative Obrien's, negative clunk and good stability. Normal scapular function observed. No painful arc and no drop arm sign. No apprehension sign Contralateral shoulder unremarkable  MSK US performed of: Right This study was ordered, performed, and interpreted by Charlann Boxer D.O.  Shoulder:   Supraspinatus:  Appears normal on long and transverse views, Bursal bulge seen with shoulder abduction on impingement view. Infraspinatus:  Appears normal on long and transverse views. Significant increase in Doppler flow Subscapularis:  Appears normal on long and transverse views. Positive bursa Teres Minor:  Appears normal on long and transverse views. AC joint:  Capsule undistended, no geyser sign. Glenohumeral Joint:  Appears normal without effusion. Glenoid Labrum:  Intact without visualized tears. Biceps Tendon:  Appears normal on long and transverse views, no fraying of tendon, tendon located in intertubercular groove, no subluxation with shoulder internal  or external rotation.  Impression: Subacromial bursitis  Procedure: Real-time  Ultrasound Guided Injection of right glenohumeral joint Device: GE Logiq E  Ultrasound guided injection is preferred based studies that show increased duration, increased effect, greater accuracy, decreased procedural pain, increased response rate with ultrasound guided versus blind injection.  Verbal informed consent obtained.  Time-out conducted.  Noted no overlying erythema, induration, or other signs of local infection.  Skin prepped in a sterile fashion.  Local anesthesia: Topical Ethyl chloride.  With sterile technique and under real time ultrasound guidance:  Joint visualized.  23g 1  inch needle inserted posterior approach. Pictures taken for needle placement. Patient did have injection of 2 cc of 1% lidocaine, 2 cc of 0.5% Marcaine, and 1.0 cc of Kenalog 40 mg/dL. Completed without difficulty  Pain immediately resolved suggesting accurate placement of the medication.  Advised to call if fevers/chills, erythema, induration, drainage, or persistent bleeding.  Images permanently stored and available for review in the ultrasound unit.  Impression: Technically successful ultrasound guided injection.    Impression and Recommendations:     This case required medical decision making of moderate complexity.

## 2014-06-06 NOTE — Assessment & Plan Note (Signed)
Patient does have more of a subacromial bursitis of the shoulder. We discussed an icing protocol, topical medicine, over-the-counter medications he can be beneficial as well. Patient did have an injection today and tolerated the procedure well with near resolution of pain. Patient given a handout of home exercises. Patient is to try these interventions and come back and see me again in 3-4 weeks.

## 2014-06-06 NOTE — Patient Instructions (Signed)
Good to meet you Ice 20 minutes after activity  Continue vitamin D 2000 IU daily Turmeric 500mg  twice daily.  Keep hands in peripheral vision.  Avoid overhead activity too much.  Come back in 3 weeks.

## 2014-06-08 ENCOUNTER — Other Ambulatory Visit: Payer: Self-pay | Admitting: Internal Medicine

## 2014-06-19 ENCOUNTER — Telehealth: Payer: Self-pay | Admitting: *Deleted

## 2014-06-19 NOTE — Telephone Encounter (Signed)
Reviewed stress echo instructions with patient  

## 2014-06-20 ENCOUNTER — Other Ambulatory Visit (INDEPENDENT_AMBULATORY_CARE_PROVIDER_SITE_OTHER): Payer: BC Managed Care – PPO | Admitting: Cardiovascular Disease

## 2014-06-20 DIAGNOSIS — R0602 Shortness of breath: Secondary | ICD-10-CM

## 2014-06-20 DIAGNOSIS — R9431 Abnormal electrocardiogram [ECG] [EKG]: Secondary | ICD-10-CM

## 2014-06-27 ENCOUNTER — Ambulatory Visit (INDEPENDENT_AMBULATORY_CARE_PROVIDER_SITE_OTHER): Payer: BC Managed Care – PPO | Admitting: Family Medicine

## 2014-06-27 ENCOUNTER — Encounter: Payer: Self-pay | Admitting: Family Medicine

## 2014-06-27 VITALS — BP 124/84 | HR 67 | Ht 62.0 in | Wt 165.0 lb

## 2014-06-27 DIAGNOSIS — IMO0002 Reserved for concepts with insufficient information to code with codable children: Secondary | ICD-10-CM

## 2014-06-27 DIAGNOSIS — M75100 Unspecified rotator cuff tear or rupture of unspecified shoulder, not specified as traumatic: Secondary | ICD-10-CM

## 2014-06-27 DIAGNOSIS — M7551 Bursitis of right shoulder: Secondary | ICD-10-CM

## 2014-06-27 NOTE — Patient Instructions (Signed)
Good to see you Ice 20 minutes 2 times daily. Usually after activity and before bed. Exercises 3 times a week.  On wall heels, butt shoulder and head touching for goal of 5 minutes daily.  Ok to start 1 minute and increase.  Consider continuing the turmeric and you can decide on the tylenol.  See me when you need me.

## 2014-06-27 NOTE — Assessment & Plan Note (Signed)
Patient has made improvement and has had pain decreased significantly. Patient though did have some mild weakness today that was not there on previous exam. We will continue to monitor closely. Patient will continue the conservative therapy at this time and come back again in 3-4 weeks.

## 2014-06-27 NOTE — Progress Notes (Signed)
  [facility] [address], [place] [address] Phone: [phone] Subjective:    I CC: Right shoulder pain followup.  WUX:LKGMWNUUVO SHENIYA GARCIAPEREZ is a 73 y.o. female coming in with complaint of right shoulder pain. Patient was seen previously and was diagnosed with more of a shoulder bursitis. Patient was given a corticosteroid injection. Patient was given home exercise program, icing protocol as well as discussion about over-the-counter medicines it could be beneficial. Patient states she has approximately 80% better. Patient states states some mild weakness compared to the other side but overall she's been doing much better. Patient has started doing her exercises glasses again and has been happy with the results so far. Patient has found some difficulty with pain when reaching behind her back but otherwise significantly better than was before. Patient actually states that everyday seems to be better. Denies any nighttime awakening.     Past medical history, social, surgical and family history all reviewed in electronic medical record.   Review of Systems: No headache, visual changes, nausea, vomiting, diarrhea, constipation, dizziness, abdominal pain, skin rash, fevers, chills, night sweats, weight loss, swollen lymph nodes, body aches, joint swelling, muscle aches, chest pain, shortness of breath, mood changes.   Objective Blood pressure 124/84, pulse 67, height 5\' 2"  (1.575 m), weight 165 lb (74.844 kg), SpO2 95.00%.  General: No apparent distress alert and oriented x3 mood and affect normal, dressed appropriately.  HEENT: Pupils equal, extraocular movements intact  Respiratory: Patient's speak in full sentences and does not appear short of breath  Cardiovascular: No lower extremity edema, non tender, no erythema  Skin: Warm dry intact with no signs of infection or rash on extremities or on axial skeleton.  Abdomen: Soft nontender  Neuro: Cranial  nerves II through XII are intact, neurovascularly intact in all extremities with 2+ DTRs and 2+ pulses.  Lymph: No lymphadenopathy of posterior or anterior cervical chain or axillae bilaterally.  Gait normal with good balance and coordination.  MSK:  Non tender with full range of motion and good stability and symmetric strength and tone of  elbows, wrist, hip, knee and ankles bilaterally.  Shoulder: Right Inspection reveals no abnormalities, atrophy or asymmetry. Palpation is normal with no tenderness over AC joint or bicipital groove. ROM is full in all planes passively. Rotator cuff strength minorly weak compared to the contralateral side. This is a new finding from previous exam. signs of impingement with positive Neer and Hawkin's tests but less than previous exam, and continued negative empty can sign. Speeds and Yergason's tests normal. No labral pathology noted with negative Obrien's, negative clunk and good stability. Normal scapular function observed. No painful arc and no drop arm sign. No apprehension sign Contralateral shoulder unremarkable     Impression and Recommendations:     This case required medical decision making of moderate complexity.

## 2014-09-17 ENCOUNTER — Other Ambulatory Visit: Payer: Self-pay | Admitting: Internal Medicine

## 2014-10-11 ENCOUNTER — Other Ambulatory Visit: Payer: Self-pay | Admitting: Internal Medicine

## 2014-11-19 LAB — HM MAMMOGRAPHY

## 2014-11-20 LAB — HM MAMMOGRAPHY: HM Mammogram: NORMAL

## 2014-11-25 ENCOUNTER — Encounter (INDEPENDENT_AMBULATORY_CARE_PROVIDER_SITE_OTHER): Payer: Self-pay

## 2014-11-25 ENCOUNTER — Encounter: Payer: Self-pay | Admitting: *Deleted

## 2014-11-25 ENCOUNTER — Ambulatory Visit (INDEPENDENT_AMBULATORY_CARE_PROVIDER_SITE_OTHER): Payer: BC Managed Care – PPO | Admitting: Internal Medicine

## 2014-11-25 ENCOUNTER — Encounter: Payer: Self-pay | Admitting: Internal Medicine

## 2014-11-25 VITALS — BP 136/82 | HR 77 | Temp 97.9°F | Ht 62.0 in | Wt 168.5 lb

## 2014-11-25 DIAGNOSIS — M7551 Bursitis of right shoulder: Secondary | ICD-10-CM

## 2014-11-25 DIAGNOSIS — R300 Dysuria: Secondary | ICD-10-CM

## 2014-11-25 DIAGNOSIS — H6120 Impacted cerumen, unspecified ear: Secondary | ICD-10-CM | POA: Insufficient documentation

## 2014-11-25 DIAGNOSIS — H6123 Impacted cerumen, bilateral: Secondary | ICD-10-CM

## 2014-11-25 DIAGNOSIS — E785 Hyperlipidemia, unspecified: Secondary | ICD-10-CM

## 2014-11-25 LAB — COMPREHENSIVE METABOLIC PANEL
ALBUMIN: 4 g/dL (ref 3.5–5.2)
ALK PHOS: 70 U/L (ref 39–117)
ALT: 31 U/L (ref 0–35)
AST: 34 U/L (ref 0–37)
BUN: 17 mg/dL (ref 6–23)
CO2: 28 meq/L (ref 19–32)
Calcium: 9.2 mg/dL (ref 8.4–10.5)
Chloride: 105 mEq/L (ref 96–112)
Creatinine, Ser: 0.8 mg/dL (ref 0.4–1.2)
GFR: 74.63 mL/min (ref >60.00)
Glucose, Bld: 94 mg/dL (ref 70–99)
POTASSIUM: 3.9 meq/L (ref 3.5–5.1)
SODIUM: 139 meq/L (ref 135–145)
TOTAL PROTEIN: 7.3 g/dL (ref 6.0–8.3)
Total Bilirubin: 0.6 mg/dL (ref 0.2–1.2)

## 2014-11-25 LAB — POCT URINALYSIS DIPSTICK
Bilirubin, UA: NEGATIVE
GLUCOSE UA: NEGATIVE
KETONES UA: NEGATIVE
Nitrite, UA: NEGATIVE
PROTEIN UA: NEGATIVE
SPEC GRAV UA: 1.015
UROBILINOGEN UA: 0.2
pH, UA: 7

## 2014-11-25 MED ORDER — CIPROFLOXACIN HCL 500 MG PO TABS: 500.0000 mg | ORAL_TABLET | Freq: Two times a day (BID) | ORAL | Status: DC

## 2014-11-25 NOTE — Patient Instructions (Signed)
Start Cipro twice daily for urinary tract infection.  Continue Debrox daily to bilateral ears.  Follow up 6 months and prn.

## 2014-11-25 NOTE — Addendum Note (Signed)
Addended by: Karlene Einstein D on: 11/25/2014 11:35 AM   Modules accepted: Orders

## 2014-11-25 NOTE — Assessment & Plan Note (Signed)
Will recheck LFTs today. Continue Simvastatin.

## 2014-11-25 NOTE — Progress Notes (Signed)
Subjective:    Patient ID: Lenoria Farrier, female    DOB: 05-24-1941, 73 y.o.   MRN: [id number]  HPI 73YO female presents for follow up.  Last 4 weeks, has had possible bladder infection. Taking Cranberry tablets with some intermittent improvement. Burning, urgency comes and goes. No fever, chills.  Right shoulder pain -  Improved after steroid injection. Started on Tumeric. Symptoms have been improved. Continues to exercise lifting weights. Tolerating well.    Past medical, surgical, family and social history per today's encounter.  Review of Systems  Constitutional: Negative for fever, chills, appetite change, fatigue and unexpected weight change.  Eyes: Negative for visual disturbance.  Respiratory: Negative for shortness of breath.   Cardiovascular: Negative for chest pain and leg swelling.  Gastrointestinal: Negative for nausea, vomiting, abdominal pain, diarrhea and constipation.  Genitourinary: Positive for dysuria, urgency and frequency. Negative for hematuria and pelvic pain.  Musculoskeletal: Positive for myalgias and arthralgias.  Skin: Negative for color change and rash.  Hematological: Negative for adenopathy. Does not bruise/bleed easily.  Psychiatric/Behavioral: Negative for dysphoric mood. The patient is not nervous/anxious.        Objective:    BP 136/82 mmHg  Pulse 77  Temp(Src) 97.9 F (36.6 C) (Oral)  Ht 5\' 2"  (1.575 m)  Wt 168 lb 8 oz (76.431 kg)  BMI 30.81 kg/m2  SpO2 96% Physical Exam  Constitutional: She is oriented to person, place, and time. She appears well-developed and well-nourished. No distress.  HENT:  Head: Normocephalic and atraumatic.  Right Ear: External ear normal.  Left Ear: External ear normal.  Nose: Nose normal.  Mouth/Throat: Oropharynx is clear and moist. No oropharyngeal exudate.  Bilateral ear canals impacted with soft cerumen  Eyes: Conjunctivae are normal. Pupils are equal, round, and reactive to light. Right eye  exhibits no discharge. Left eye exhibits no discharge. No scleral icterus.  Neck: Normal range of motion. Neck supple. No tracheal deviation present. No thyromegaly present.  Cardiovascular: Normal rate, regular rhythm, normal heart sounds and intact distal pulses.  Exam reveals no gallop and no friction rub.   No murmur heard. Pulmonary/Chest: Effort normal and breath sounds normal. No accessory muscle usage. No tachypnea. No respiratory distress. She has no decreased breath sounds. She has no wheezes. She has no rhonchi. She has no rales. She exhibits no tenderness.  Musculoskeletal: Normal range of motion. She exhibits no edema or tenderness.  Lymphadenopathy:    She has no cervical adenopathy.  Neurological: She is alert and oriented to person, place, and time. No cranial nerve deficit. She exhibits normal muscle tone. Coordination normal.  Skin: Skin is warm and dry. No rash noted. She is not diaphoretic. No erythema. No pallor.  Psychiatric: She has a normal mood and affect. Her behavior is normal. Judgment and thought content normal.          Assessment & Plan:   Problem List Items Addressed This Visit      Unprioritized   Cerumen impaction    Bilateral cerumen impaction with soft wax. Will continue Debrox daily. If no improvement, recommended setting up ENT evaluation for wax removal under direct visualization.    HLD (hyperlipidemia)    Will recheck LFTs today. Continue Simvastatin.    Relevant Orders      Comprehensive metabolic panel    Other Visit Diagnoses    Dysuria    -  Primary    Relevant Orders       POCT Urinalysis Dipstick  No Follow-up on file.

## 2014-11-25 NOTE — Assessment & Plan Note (Signed)
Symptoms improved after steroid injection. Will follow.

## 2014-11-25 NOTE — Assessment & Plan Note (Signed)
Bilateral cerumen impaction with soft wax. Will continue Debrox daily. If no improvement, recommended setting up ENT evaluation for wax removal under direct visualization.

## 2014-11-25 NOTE — Progress Notes (Signed)
Pre visit review using our clinic review tool, if applicable. No additional management support is needed unless otherwise documented below in the visit note. 

## 2014-11-25 NOTE — Assessment & Plan Note (Signed)
Recent dysuria. UA pos for blood and leuk. Will send urine for culture. Start Cipro 500mg  po bid. Continue prn Azo. Follow up if symptoms are not improving.

## 2014-11-26 ENCOUNTER — Encounter: Payer: Self-pay | Admitting: *Deleted

## 2014-11-28 LAB — CULTURE, URINE COMPREHENSIVE

## 2014-12-23 ENCOUNTER — Other Ambulatory Visit: Payer: Self-pay | Admitting: Internal Medicine

## 2015-03-19 ENCOUNTER — Other Ambulatory Visit: Payer: Self-pay | Admitting: Internal Medicine

## 2015-04-18 ENCOUNTER — Other Ambulatory Visit: Payer: Self-pay | Admitting: Internal Medicine

## 2015-05-28 ENCOUNTER — Encounter: Payer: Self-pay | Admitting: *Deleted

## 2015-05-28 ENCOUNTER — Ambulatory Visit (INDEPENDENT_AMBULATORY_CARE_PROVIDER_SITE_OTHER): Payer: Medicare Other | Admitting: Internal Medicine

## 2015-05-28 ENCOUNTER — Encounter: Payer: Self-pay | Admitting: Internal Medicine

## 2015-05-28 VITALS — BP 114/74 | HR 69 | Temp 97.7°F | Ht 62.5 in | Wt 167.2 lb

## 2015-05-28 DIAGNOSIS — Z0000 Encounter for general adult medical examination without abnormal findings: Secondary | ICD-10-CM

## 2015-05-28 DIAGNOSIS — Z780 Asymptomatic menopausal state: Secondary | ICD-10-CM

## 2015-05-28 DIAGNOSIS — Z1211 Encounter for screening for malignant neoplasm of colon: Secondary | ICD-10-CM

## 2015-05-28 DIAGNOSIS — E669 Obesity, unspecified: Secondary | ICD-10-CM

## 2015-05-28 LAB — COMPREHENSIVE METABOLIC PANEL
ALT: 24 U/L (ref 0–35)
AST: 28 U/L (ref 0–37)
Albumin: 4.1 g/dL (ref 3.5–5.2)
Alkaline Phosphatase: 66 U/L (ref 39–117)
BUN: 20 mg/dL (ref 6–23)
CO2: 31 mEq/L (ref 19–32)
Calcium: 9.1 mg/dL (ref 8.4–10.5)
Chloride: 104 mEq/L (ref 96–112)
Creatinine, Ser: 0.73 mg/dL (ref 0.40–1.20)
GFR: 82.84 mL/min (ref >60.00)
Glucose, Bld: 105 mg/dL — ABNORMAL HIGH (ref 70–99)
Potassium: 3.8 mEq/L (ref 3.5–5.1)
Sodium: 138 mEq/L (ref 135–145)
TOTAL PROTEIN: 7.1 g/dL (ref 6.0–8.3)
Total Bilirubin: 0.5 mg/dL (ref 0.2–1.2)

## 2015-05-28 LAB — CBC WITH DIFFERENTIAL/PLATELET
BASOS PCT: 0.8 % (ref 0.0–3.0)
Basophils Absolute: 0.1 10*3/uL (ref 0.0–0.1)
EOS ABS: 0.4 10*3/uL (ref 0.0–0.7)
Eosinophils Relative: 5.3 % — ABNORMAL HIGH (ref 0.0–5.0)
HCT: 41.1 % (ref 36.0–46.0)
Hemoglobin: 13.6 g/dL (ref 12.0–15.0)
LYMPHS ABS: 2 10*3/uL (ref 0.7–4.0)
Lymphocytes Relative: 28.9 % (ref 12.0–46.0)
MCHC: 33.2 g/dL (ref 30.0–36.0)
MCV: 86.3 fl (ref 78.0–100.0)
MONOS PCT: 7.4 % (ref 3.0–12.0)
Monocytes Absolute: 0.5 10*3/uL (ref 0.1–1.0)
Neutro Abs: 4 10*3/uL (ref 1.4–7.7)
Neutrophils Relative %: 57.6 % (ref 43.0–77.0)
PLATELETS: 245 10*3/uL (ref 150.0–400.0)
RBC: 4.76 Mil/uL (ref 3.87–5.11)
RDW: 14.6 % (ref 11.5–15.5)
WBC: 7 10*3/uL (ref 4.0–10.5)

## 2015-05-28 LAB — LIPID PANEL
CHOL/HDL RATIO: 3
Cholesterol: 172 mg/dL (ref 0–200)
HDL: 61.9 mg/dL (ref >39.00)
LDL CALC: 88 mg/dL (ref 0–99)
NONHDL: 110.1
TRIGLYCERIDES: 112 mg/dL (ref 0.0–149.0)
VLDL: 22.4 mg/dL (ref 0.0–40.0)

## 2015-05-28 LAB — MICROALBUMIN / CREATININE URINE RATIO
CREATININE, U: 276.9 mg/dL
Microalb Creat Ratio: 1.1 mg/g (ref 0.0–30.0)
Microalb, Ur: 3 mg/dL — ABNORMAL HIGH (ref 0.0–1.9)

## 2015-05-28 LAB — VITAMIN D 25 HYDROXY (VIT D DEFICIENCY, FRACTURES): VITD: 30.38 ng/mL (ref 30.00–100.00)

## 2015-05-28 MED ORDER — VENLAFAXINE HCL ER 150 MG PO CP24: 150.0000 mg | ORAL_CAPSULE | Freq: Every day | ORAL | Status: DC

## 2015-05-28 NOTE — Progress Notes (Signed)
Pre visit review using our clinic review tool, if applicable. No additional management support is needed unless otherwise documented below in the visit note. 

## 2015-05-28 NOTE — Progress Notes (Addendum)
Subjective:    Patient ID: Stephanie Mora, female    DOB: 07-Aug-1941, 74 y.o.   MRN: [id number]  HPI  The patient is here for annual Medicare Wellness Examination and management of other chronic and acute problems.   The risk factors are reflected in the history.  The roster of all physicians providing medical care to patient - is listed in the Snapshot section of the chart.  Activities of daily living:   The patient is 100% independent in all ADLs: dressing, toileting, feeding as well as independent mobility. Patient lives with husband in home, two story home. One cat.  Home safety :  The patient has smoke detectors in the home.  They wear seatbelts in their car. There are no firearms at home.  There is no violence in the home. They feel safe where they live.  Infectious Risks: There is no risks for hepatitis, STDs or HIV.  There is no  history of blood transfusion.  They have no travel history to infectious disease endemic areas of the world.  Additional Health Care Providers: The patient has seen their dentist in the last six months. Dentist - Dr. Lynelle Smoke They have seen their eye doctor in the last year. Opthalmologist - [facility] They deny hearing issues. They have deferred audiologic testing in the last year.   They do not  have excessive sun exposure. Discussed the need for sun protection: hats,long sleeves and use of sunscreen if there is significant sun exposure.  Dermatologist - previously, Dr. Kellie Moor  Diet: the importance of a healthy diet is discussed. They do have a healthy diet.  The benefits of regular aerobic exercise were discussed. Patient exercises by going to gym twice weekly, strength training.  Depression screen: there are no signs or vegative symptoms of depression- irritability, change in appetite, anhedonia, sadness/tearfullness.  Cognitive assessment: the patient manages all their financial and personal affairs and is actively engaged.    HCPOA - son, Talmage Coin. [place]  The following portions of the patient's history were reviewed and updated as appropriate: allergies, current medications, past family history, past medical history,  past surgical history, past social history and problem list.  Visual acuity was not assessed per patient preference as they have regular follow up with their ophthalmologist. Hearing and body mass index were assessed and reviewed.   During the course of the visit the patient was educated and counseled about appropriate screening and preventive services including : fall prevention , diabetes screening, nutrition counseling, colorectal cancer screening, and recommended immunizations.       Past medical, surgical, family and social history per today's encounter.  Review of Systems  Constitutional: Negative for fever, chills, appetite change, fatigue and unexpected weight change.  Eyes: Negative for visual disturbance.  Respiratory: Negative for shortness of breath.   Cardiovascular: Negative for chest pain and leg swelling.  Gastrointestinal: Negative for nausea, vomiting, abdominal pain, diarrhea and constipation.  Musculoskeletal: Negative for myalgias and arthralgias.  Skin: Negative for color change and rash.  Hematological: Negative for adenopathy. Does not bruise/bleed easily.  Psychiatric/Behavioral: Negative for suicidal ideas, sleep disturbance and dysphoric mood. The patient is not nervous/anxious.        Objective:    BP 114/74 mmHg  Pulse 69  Temp(Src) 97.7 F (36.5 C) (Oral)  Ht 5' 2.5" (1.588 m)  Wt 167 lb 4 oz (75.864 kg)  BMI 30.08 kg/m2  SpO2 97% Physical Exam  Constitutional: She is oriented to person, place, and  time. She appears well-developed and well-nourished. No distress.  HENT:  Head: Normocephalic and atraumatic.  Right Ear: External ear normal.  Left Ear: External ear normal.  Nose: Nose normal.  Mouth/Throat: Oropharynx is clear and moist. No  oropharyngeal exudate.  Eyes: Conjunctivae are normal. Pupils are equal, round, and reactive to light. Right eye exhibits no discharge. Left eye exhibits no discharge. No scleral icterus.  Neck: Normal range of motion. Neck supple. No tracheal deviation present. No thyromegaly present.  Cardiovascular: Normal rate, regular rhythm, normal heart sounds and intact distal pulses.  Exam reveals no gallop and no friction rub.   No murmur heard. Pulmonary/Chest: Effort normal and breath sounds normal. No accessory muscle usage. No tachypnea. No respiratory distress. She has no decreased breath sounds. She has no wheezes. She has no rales. She exhibits no tenderness. Right breast exhibits no inverted nipple, no mass, no nipple discharge, no skin change and no tenderness. Left breast exhibits no inverted nipple, no mass, no nipple discharge, no skin change and no tenderness. Breasts are symmetrical.  Abdominal: Soft. Bowel sounds are normal. She exhibits no distension and no mass. There is no tenderness. There is no rebound and no guarding.  Musculoskeletal: Normal range of motion. She exhibits no edema or tenderness.  Lymphadenopathy:    She has no cervical adenopathy.  Neurological: She is alert and oriented to person, place, and time. No cranial nerve deficit. She exhibits normal muscle tone. Coordination normal.  Skin: Skin is warm and dry. No rash noted. She is not diaphoretic. No erythema. No pallor.  Psychiatric: She has a normal mood and affect. Her behavior is normal. Judgment and thought content normal.          Assessment & Plan:   Problem List Items Addressed This Visit      Unprioritized   Medicare annual wellness visit, subsequent - Primary    General medical exam normal today including breast exam. PAP and pelvic deferred as normal in past, and given pt age and preference. Mammogram UTD and reviewed. Colonoscopy ordered. Bone density ordered. Labs as ordered. Immunizations. UTD.  Encouraged healthy diet and exercise.  Patient was given a handout regarding current recommendations for health maintenance and preventative care on the AVS.       Relevant Orders   CBC with Differential/Platelet   Comprehensive metabolic panel   Lipid panel   Microalbumin / creatinine urine ratio   Vit D  25 hydroxy (rtn osteoporosis monitoring)   Obesity (BMI 30-39.9)    Wt Readings from Last 3 Encounters:  05/28/15 167 lb 4 oz (75.864 kg)  11/25/14 168 lb 8 oz (76.431 kg)  06/27/14 165 lb (74.844 kg)   Body mass index is 30.08 kg/(m^2). Encouraged healthy diet and exercise.       Other Visit Diagnoses    Screening for colon cancer        Relevant Orders    Ambulatory referral to Gastroenterology    Postmenopausal estrogen deficiency            Return in about 6 months (around 11/27/2015) for Recheck.

## 2015-05-28 NOTE — Assessment & Plan Note (Signed)
Wt Readings from Last 3 Encounters:  05/28/15 167 lb 4 oz (75.864 kg)  11/25/14 168 lb 8 oz (76.431 kg)  06/27/14 165 lb (74.844 kg)   Body mass index is 30.08 kg/(m^2). Encouraged healthy diet and exercise.

## 2015-05-28 NOTE — Patient Instructions (Signed)

## 2015-05-28 NOTE — Assessment & Plan Note (Addendum)
General medical exam normal today including breast exam. PAP and pelvic deferred as normal in past, and given pt age and preference. Mammogram UTD and reviewed. Colonoscopy ordered. Bone density ordered. Labs as ordered. Immunizations. UTD. Encouraged healthy diet and exercise.  Patient was given a handout regarding current recommendations for health maintenance and preventative care on the AVS.

## 2015-05-28 NOTE — Addendum Note (Signed)
Addended by: Ronette Deter A on: 05/28/2015 09:30 AM   Modules accepted: Miquel Dunn

## 2015-07-22 ENCOUNTER — Other Ambulatory Visit: Payer: Self-pay | Admitting: Internal Medicine

## 2015-10-29 ENCOUNTER — Other Ambulatory Visit: Payer: Self-pay | Admitting: Internal Medicine

## 2015-11-27 LAB — HM MAMMOGRAPHY

## 2015-11-28 ENCOUNTER — Ambulatory Visit: Payer: Medicare Other | Admitting: Internal Medicine

## 2015-12-05 ENCOUNTER — Ambulatory Visit (INDEPENDENT_AMBULATORY_CARE_PROVIDER_SITE_OTHER): Payer: Medicare Other | Admitting: Internal Medicine

## 2015-12-05 ENCOUNTER — Encounter: Payer: Self-pay | Admitting: Internal Medicine

## 2015-12-05 VITALS — BP 130/82 | HR 68 | Temp 97.6°F | Ht 62.5 in | Wt 166.8 lb

## 2015-12-05 DIAGNOSIS — IMO0001 Reserved for inherently not codable concepts without codable children: Secondary | ICD-10-CM | POA: Insufficient documentation

## 2015-12-05 DIAGNOSIS — M810 Age-related osteoporosis without current pathological fracture: Secondary | ICD-10-CM | POA: Diagnosis not present

## 2015-12-05 DIAGNOSIS — L309 Dermatitis, unspecified: Secondary | ICD-10-CM | POA: Diagnosis not present

## 2015-12-05 DIAGNOSIS — R030 Elevated blood-pressure reading, without diagnosis of hypertension: Secondary | ICD-10-CM

## 2015-12-05 DIAGNOSIS — E785 Hyperlipidemia, unspecified: Secondary | ICD-10-CM

## 2015-12-05 DIAGNOSIS — M6530 Trigger finger, unspecified finger: Secondary | ICD-10-CM | POA: Insufficient documentation

## 2015-12-05 LAB — COMPREHENSIVE METABOLIC PANEL
ALBUMIN: 4 g/dL (ref 3.5–5.2)
ALK PHOS: 71 U/L (ref 39–117)
ALT: 26 U/L (ref 0–35)
AST: 29 U/L (ref 0–37)
BILIRUBIN TOTAL: 0.5 mg/dL (ref 0.2–1.2)
BUN: 15 mg/dL (ref 6–23)
CALCIUM: 9 mg/dL (ref 8.4–10.5)
CO2: 29 mEq/L (ref 19–32)
Chloride: 103 mEq/L (ref 96–112)
Creatinine, Ser: 0.67 mg/dL (ref 0.40–1.20)
GFR: 91.32 mL/min (ref >60.00)
GLUCOSE: 94 mg/dL (ref 70–99)
Potassium: 4.1 mEq/L (ref 3.5–5.1)
Sodium: 139 mEq/L (ref 135–145)
TOTAL PROTEIN: 6.6 g/dL (ref 6.0–8.3)

## 2015-12-05 LAB — LIPID PANEL
Cholesterol: 189 mg/dL (ref 0–200)
HDL: 64.4 mg/dL (ref >39.00)
LDL Cholesterol: 108 mg/dL — ABNORMAL HIGH (ref 0–99)
NONHDL: 124.81
TRIGLYCERIDES: 85 mg/dL (ref 0.0–149.0)
Total CHOL/HDL Ratio: 3
VLDL: 17 mg/dL (ref 0.0–40.0)

## 2015-12-05 MED ORDER — GENTAMICIN SULFATE 0.1 % EX OINT: 1.0000 "application " | TOPICAL_OINTMENT | Freq: Two times a day (BID) | CUTANEOUS | Status: DC

## 2015-12-05 NOTE — Assessment & Plan Note (Signed)
Will check lipids with labs today. Continue Simvastatin. 

## 2015-12-05 NOTE — Assessment & Plan Note (Signed)
Will set up evaluation with hand surgeon for possible repair.

## 2015-12-05 NOTE — Patient Instructions (Addendum)
We will set up Bone Density Testing.  Continue Vit D 2000units daily.  Start topical Gentamicin twice daily to rash over chest.  Call if symptoms not improving.

## 2015-12-05 NOTE — Progress Notes (Signed)
Subjective:    Patient ID: Stephanie Mora, female    DOB: 03-Jun-1941, 74 y.o.   MRN: [id number]  HPI  74YO female presents for follow up.  Osteoporosis - Had Reclast in past. Due for bone density testing.  Skin rash - Across chest. Started in November. Red blistered areas. Occasional itching. No purulent drainage noted. Not taking anything for this.  Trigger finger - Ongoing for years. Right hand. Much worse. Affecting quality of life because she is unable to garden and do other things with her hands. No pain noted.  Wt Readings from Last 3 Encounters:  12/05/15 166 lb 12 oz (75.637 kg)  05/28/15 167 lb 4 oz (75.864 kg)  11/25/14 168 lb 8 oz (76.431 kg)   BP Readings from Last 3 Encounters:  12/05/15 130/82  05/28/15 114/74  11/25/14 136/82    Past Medical History  Diagnosis Date  . Osteoporosis     reclast treatment in past  . Hyperlipidemia   . Bilateral dry eyes   . Heart murmur     as child  . Cancer [facility]) 2000    breast right, lumpectomy and XRT   Family History  Problem Relation Age of Onset  . Cancer Mother   . Heart disease Father   . Heart attack Father 69  . Cancer Maternal Aunt    Past Surgical History  Procedure Laterality Date  . Breast lumpectomy     Social History   Social History  . Marital Status: Married    Spouse Name: N/A  . Number of Children: N/A  . Years of Education: N/A   Social History Main Topics  . Smoking status: Former Research scientist (life sciences)  . Smokeless tobacco: None  . Alcohol Use: Yes     Comment: occasional  . Drug Use: No  . Sexual Activity: Not Asked   Other Topics Concern  . None   Social History Narrative   Lives in [place] with husband.      Diet - healthy diet   Exercise - [employer] weight training    Review of Systems  Constitutional: Negative for fever, chills, appetite change, fatigue and unexpected weight change.  Eyes: Negative for visual disturbance.  Respiratory: Negative for shortness of breath.     Cardiovascular: Negative for chest pain and leg swelling.  Gastrointestinal: Negative for vomiting, abdominal pain, diarrhea and constipation.  Musculoskeletal: Negative for myalgias, joint swelling, arthralgias and gait problem.  Skin: Positive for color change and rash.  Hematological: Negative for adenopathy. Does not bruise/bleed easily.  Psychiatric/Behavioral: Negative for dysphoric mood. The patient is not nervous/anxious.        Objective:    BP 130/82 mmHg  Pulse 68  Temp(Src) 97.6 F (36.4 C) (Oral)  Ht 5' 2.5" (1.588 m)  Wt 166 lb 12 oz (75.637 kg)  BMI 29.99 kg/m2  SpO2 96% Physical Exam  Constitutional: She is oriented to person, place, and time. She appears well-developed and well-nourished. No distress.  HENT:  Head: Normocephalic and atraumatic.  Right Ear: External ear normal.  Left Ear: External ear normal.  Nose: Nose normal.  Mouth/Throat: Oropharynx is clear and moist. No oropharyngeal exudate.  Eyes: Conjunctivae are normal. Pupils are equal, round, and reactive to light. Right eye exhibits no discharge. Left eye exhibits no discharge. No scleral icterus.  Neck: Normal range of motion. Neck supple. No tracheal deviation present. No thyromegaly present.  Cardiovascular: Normal rate, regular rhythm, normal heart sounds and intact distal pulses.  Exam reveals  no gallop and no friction rub.   No murmur heard. Pulmonary/Chest: Effort normal and breath sounds normal. No respiratory distress. She has no wheezes. She has no rales. She exhibits no tenderness.  Musculoskeletal: Normal range of motion. She exhibits no edema or tenderness.       Hands: Lymphadenopathy:    She has no cervical adenopathy.  Neurological: She is alert and oriented to person, place, and time. No cranial nerve deficit. She exhibits normal muscle tone. Coordination normal.  Skin: Skin is warm and dry. Rash noted. Rash is pustular (over anterior chest wall). She is not diaphoretic. There is  erythema. No pallor.  Psychiatric: She has a normal mood and affect. Her behavior is normal. Judgment and thought content normal.          Assessment & Plan:   Problem List Items Addressed This Visit      Unprioritized   Dermatitis    Rash most consistent with dermatitis with early bacterial infection, likely related to scratching. Will start topical Gentamicin. Follow up if symptoms not improving.      Relevant Medications   gentamicin ointment (GARAMYCIN) 0.1 %   Elevated BP    BP Readings from Last 3 Encounters:  12/05/15 130/82  05/28/15 114/74  11/25/14 136/82   BP initially elevated, then improved with rest. Will continue to monitor.      Hyperlipidemia - Primary    Will check lipids with labs today. Continue Simvastatin.      Relevant Orders   Comprehensive metabolic panel   Lipid panel   Osteoporosis    Will set up bone density testing.      Trigger finger, acquired    Will set up evaluation with hand surgeon for possible repair.      Relevant Orders   Ambulatory referral to Hand Surgery       Return in about 6 months (around 06/04/2016) for Recheck.

## 2015-12-05 NOTE — Assessment & Plan Note (Signed)
BP Readings from Last 3 Encounters:  12/05/15 130/82  05/28/15 114/74  11/25/14 136/82   BP initially elevated, then improved with rest. Will continue to monitor.

## 2015-12-05 NOTE — Assessment & Plan Note (Signed)
Rash most consistent with dermatitis with early bacterial infection, likely related to scratching. Will start topical Gentamicin. Follow up if symptoms not improving.

## 2015-12-05 NOTE — Progress Notes (Signed)
Pre visit review using our clinic review tool, if applicable. No additional management support is needed unless otherwise documented below in the visit note. 

## 2015-12-05 NOTE — Assessment & Plan Note (Signed)
Will set up bone density testing. 

## 2016-01-22 DIAGNOSIS — K219 Gastro-esophageal reflux disease without esophagitis: Secondary | ICD-10-CM | POA: Diagnosis not present

## 2016-01-22 DIAGNOSIS — M810 Age-related osteoporosis without current pathological fracture: Secondary | ICD-10-CM | POA: Diagnosis not present

## 2016-01-22 DIAGNOSIS — M67441 Ganglion, right hand: Secondary | ICD-10-CM | POA: Diagnosis not present

## 2016-01-22 DIAGNOSIS — M6740 Ganglion, unspecified site: Secondary | ICD-10-CM | POA: Diagnosis not present

## 2016-01-22 DIAGNOSIS — M6530 Trigger finger, unspecified finger: Secondary | ICD-10-CM | POA: Diagnosis not present

## 2016-01-22 DIAGNOSIS — M65331 Trigger finger, right middle finger: Secondary | ICD-10-CM | POA: Diagnosis not present

## 2016-02-06 DIAGNOSIS — L981 Factitial dermatitis: Secondary | ICD-10-CM | POA: Diagnosis not present

## 2016-05-31 ENCOUNTER — Other Ambulatory Visit: Payer: Self-pay | Admitting: Internal Medicine

## 2016-06-23 ENCOUNTER — Ambulatory Visit (INDEPENDENT_AMBULATORY_CARE_PROVIDER_SITE_OTHER): Payer: Medicare Other | Admitting: Internal Medicine

## 2016-06-23 ENCOUNTER — Encounter: Payer: Self-pay | Admitting: Internal Medicine

## 2016-06-23 VITALS — BP 124/84 | HR 87 | Ht 62.0 in | Wt 166.8 lb

## 2016-06-23 DIAGNOSIS — E785 Hyperlipidemia, unspecified: Secondary | ICD-10-CM

## 2016-06-23 DIAGNOSIS — F32A Depression, unspecified: Secondary | ICD-10-CM

## 2016-06-23 DIAGNOSIS — F329 Major depressive disorder, single episode, unspecified: Secondary | ICD-10-CM

## 2016-06-23 DIAGNOSIS — M810 Age-related osteoporosis without current pathological fracture: Secondary | ICD-10-CM | POA: Diagnosis not present

## 2016-06-23 LAB — COMPREHENSIVE METABOLIC PANEL
ALT: 22 U/L (ref 0–35)
AST: 24 U/L (ref 0–37)
Albumin: 4.1 g/dL (ref 3.5–5.2)
Alkaline Phosphatase: 66 U/L (ref 39–117)
BUN: 20 mg/dL (ref 6–23)
CHLORIDE: 104 meq/L (ref 96–112)
CO2: 30 meq/L (ref 19–32)
CREATININE: 0.84 mg/dL (ref 0.40–1.20)
Calcium: 9.3 mg/dL (ref 8.4–10.5)
GFR: 70.24 mL/min (ref >60.00)
Glucose, Bld: 100 mg/dL — ABNORMAL HIGH (ref 70–99)
POTASSIUM: 3.6 meq/L (ref 3.5–5.1)
SODIUM: 138 meq/L (ref 135–145)
Total Bilirubin: 0.4 mg/dL (ref 0.2–1.2)
Total Protein: 7 g/dL (ref 6.0–8.3)

## 2016-06-23 MED ORDER — VENLAFAXINE HCL ER 150 MG PO CP24: 150.0000 mg | ORAL_CAPSULE | Freq: Every day | ORAL | Status: DC

## 2016-06-23 MED ORDER — SIMVASTATIN 20 MG PO TABS: 20.0000 mg | ORAL_TABLET | Freq: Every evening | ORAL | Status: DC

## 2016-06-23 NOTE — Assessment & Plan Note (Signed)
Symptoms well controlled on Venlafaxine. Will continue. 

## 2016-06-23 NOTE — Progress Notes (Signed)
Subjective:    Patient ID: Stephanie Mora, female    DOB: 1941/10/05, 75 y.o.   MRN: [id number]  HPI  75YO female presents for follow up.  Feeling well. No concerns today. Compliant with medication.   Wt Readings from Last 3 Encounters:  06/23/16 166 lb 12.8 oz (75.66 kg)  12/05/15 166 lb 12 oz (75.637 kg)  05/28/15 167 lb 4 oz (75.864 kg)   BP Readings from Last 3 Encounters:  06/23/16 124/84  12/05/15 130/82  05/28/15 114/74    Past Medical History  Diagnosis Date  . Osteoporosis     reclast treatment in past  . Hyperlipidemia   . Bilateral dry eyes   . Heart murmur     as child  . Cancer [facility]) 2000    breast right, lumpectomy and XRT   Family History  Problem Relation Age of Onset  . Cancer Mother   . Heart disease Father   . Heart attack Father 59  . Cancer Maternal Aunt    Past Surgical History  Procedure Laterality Date  . Breast lumpectomy     Social History   Social History  . Marital Status: Married    Spouse Name: N/A  . Number of Children: N/A  . Years of Education: N/A   Social History Main Topics  . Smoking status: Former Research scientist (life sciences)  . Smokeless tobacco: None  . Alcohol Use: Yes     Comment: occasional  . Drug Use: No  . Sexual Activity: Not Asked   Other Topics Concern  . None   Social History Narrative   Lives in [place] with husband.      Diet - healthy diet   Exercise - [employer] weight training    Review of Systems  Constitutional: Negative for fever, chills, appetite change, fatigue and unexpected weight change.  Eyes: Negative for visual disturbance.  Respiratory: Negative for cough and shortness of breath.   Cardiovascular: Negative for chest pain and leg swelling.  Gastrointestinal: Negative for nausea, vomiting, abdominal pain, diarrhea and constipation.  Musculoskeletal: Negative for myalgias and arthralgias.  Skin: Negative for color change and rash.  Hematological: Negative for adenopathy. Does not  bruise/bleed easily.  Psychiatric/Behavioral: Negative for sleep disturbance and dysphoric mood. The patient is not nervous/anxious.        Objective:    BP 124/84 mmHg  Pulse 87  Ht 5\' 2"  (1.575 m)  Wt 166 lb 12.8 oz (75.66 kg)  BMI 30.50 kg/m2  SpO2 96% Physical Exam  Constitutional: She is oriented to person, place, and time. She appears well-developed and well-nourished. No distress.  HENT:  Head: Normocephalic and atraumatic.  Right Ear: External ear normal.  Left Ear: External ear normal.  Nose: Nose normal.  Mouth/Throat: Oropharynx is clear and moist. No oropharyngeal exudate.  Eyes: Conjunctivae are normal. Pupils are equal, round, and reactive to light. Right eye exhibits no discharge. Left eye exhibits no discharge. No scleral icterus.  Neck: Normal range of motion. Neck supple. No tracheal deviation present. No thyromegaly present.  Cardiovascular: Normal rate, regular rhythm, normal heart sounds and intact distal pulses.  Exam reveals no gallop and no friction rub.   No murmur heard. Pulmonary/Chest: Effort normal and breath sounds normal. No respiratory distress. She has no wheezes. She has no rales. She exhibits no tenderness.  Musculoskeletal: Normal range of motion. She exhibits no edema or tenderness.  Lymphadenopathy:    She has no cervical adenopathy.  Neurological: She is alert and oriented  to person, place, and time. No cranial nerve deficit. She exhibits normal muscle tone. Coordination normal.  Skin: Skin is warm and dry. No rash noted. She is not diaphoretic. No erythema. No pallor.  Psychiatric: She has a normal mood and affect. Her behavior is normal. Judgment and thought content normal.          Assessment & Plan:   Problem List Items Addressed This Visit      Unprioritized   Depression (Chronic)    Symptoms well controlled on Venlafaxine. Will continue.      Relevant Medications   venlafaxine XR (EFFEXOR-XR) 150 MG 24 hr capsule    Hyperlipidemia - Primary (Chronic)    Will check LFTS with labs. Continue Simvastatin. Recheck fasting lipids in 12/2016 with physical exam.      Relevant Medications   simvastatin (ZOCOR) 20 MG tablet   Other Relevant Orders   Comprehensive metabolic panel   Osteoporosis (Chronic)    Will set up bone density testing.      Relevant Orders   DG Bone Density       Return in about 6 months (around 12/24/2016) for Physical and Establish Care.  Ronette Deter, MD [facility]

## 2016-06-23 NOTE — Assessment & Plan Note (Signed)
Will check LFTS with labs. Continue Simvastatin. Recheck fasting lipids in 12/2016 with physical exam.

## 2016-06-23 NOTE — Progress Notes (Signed)
Pre visit review using our clinic review tool, if applicable. No additional management support is needed unless otherwise documented below in the visit note. 

## 2016-06-23 NOTE — Patient Instructions (Addendum)
Labs today.  We will set up bone density testing.

## 2016-06-23 NOTE — Assessment & Plan Note (Signed)
Will set up bone density testing. 

## 2016-06-23 NOTE — Addendum Note (Signed)
Addended by: Frutoso Chase A on: 06/23/2016 10:30 AM   Modules accepted: SmartSet

## 2016-06-28 DIAGNOSIS — M810 Age-related osteoporosis without current pathological fracture: Secondary | ICD-10-CM | POA: Diagnosis not present

## 2016-07-11 ENCOUNTER — Encounter: Payer: Self-pay | Admitting: Internal Medicine

## 2016-07-19 ENCOUNTER — Encounter: Payer: Self-pay | Admitting: Internal Medicine

## 2016-07-20 NOTE — Telephone Encounter (Signed)
Pt came in and dropped off Dexa from [place] from 2012.Marland Kitchen Please advise pt on the difference between the 45yrs..  Placed in Dr. Derry Skill box

## 2016-07-21 ENCOUNTER — Telehealth: Payer: Self-pay | Admitting: Internal Medicine

## 2016-07-21 NOTE — Telephone Encounter (Signed)
I reviewed DEXA from [facility] 2012. It is not recommended to compare DEXAs between two different machines, however the level of osteoporosis in the lumbar spine appears to be similar to levels on recent scan. I, again, would recommend she set up a follow up visit to discuss potential treatment of osteoporosis.

## 2016-07-21 NOTE — Telephone Encounter (Signed)
Sent via mychart

## 2016-10-27 ENCOUNTER — Ambulatory Visit (INDEPENDENT_AMBULATORY_CARE_PROVIDER_SITE_OTHER): Payer: Medicare Other

## 2016-10-27 DIAGNOSIS — Z23 Encounter for immunization: Secondary | ICD-10-CM

## 2016-12-10 DIAGNOSIS — H2513 Age-related nuclear cataract, bilateral: Secondary | ICD-10-CM | POA: Diagnosis not present

## 2016-12-27 ENCOUNTER — Encounter: Payer: Self-pay | Admitting: Family Medicine

## 2016-12-27 ENCOUNTER — Ambulatory Visit (INDEPENDENT_AMBULATORY_CARE_PROVIDER_SITE_OTHER): Payer: Medicare Other | Admitting: Family Medicine

## 2016-12-27 VITALS — BP 124/80 | HR 92 | Temp 98.1°F | Ht 62.0 in | Wt 167.8 lb

## 2016-12-27 DIAGNOSIS — Z1329 Encounter for screening for other suspected endocrine disorder: Secondary | ICD-10-CM | POA: Diagnosis not present

## 2016-12-27 DIAGNOSIS — Z0001 Encounter for general adult medical examination with abnormal findings: Secondary | ICD-10-CM

## 2016-12-27 DIAGNOSIS — Z130 Encounter for screening for diseases of the blood and blood-forming organs and certain disorders involving the immune mechanism: Secondary | ICD-10-CM | POA: Diagnosis not present

## 2016-12-27 DIAGNOSIS — Z1231 Encounter for screening mammogram for malignant neoplasm of breast: Secondary | ICD-10-CM

## 2016-12-27 DIAGNOSIS — Z13220 Encounter for screening for lipoid disorders: Secondary | ICD-10-CM | POA: Diagnosis not present

## 2016-12-27 DIAGNOSIS — R251 Tremor, unspecified: Secondary | ICD-10-CM

## 2016-12-27 DIAGNOSIS — Z0000 Encounter for general adult medical examination without abnormal findings: Secondary | ICD-10-CM | POA: Diagnosis not present

## 2016-12-27 DIAGNOSIS — K219 Gastro-esophageal reflux disease without esophagitis: Secondary | ICD-10-CM

## 2016-12-27 DIAGNOSIS — Z1239 Encounter for other screening for malignant neoplasm of breast: Secondary | ICD-10-CM

## 2016-12-27 NOTE — Assessment & Plan Note (Signed)
Suspect essential tremor given that it only occurs with holding a cup or mirror at this time. Discussed continuing to monitor this and if it worsens or changes letting us know.

## 2016-12-27 NOTE — Assessment & Plan Note (Addendum)
Patient with infrequent symptoms. She wants to try wearing looser fitting clothes and I did discuss some dietary changes with her. If her symptoms become more persistent she will let us know so we can refer to GI for a EGD.

## 2016-12-27 NOTE — Patient Instructions (Signed)
Nice to see you. Please continue to work on diet and exercise. Please schedule your mammogram. Please monitor your tremor and work on the interventions we discussed for reflux. Please schedule a fasting lab visit.

## 2016-12-27 NOTE — Progress Notes (Signed)
Pre visit review using our clinic review tool, if applicable. No additional management support is needed unless otherwise documented below in the visit note. 

## 2016-12-27 NOTE — Progress Notes (Signed)
Tommi Rumps, MD Phone: [phone]  Stephanie Mora is a 76 y.o. female who presents today for physical exam.  Diet is described as low-fat. Lots of chicken. Cooks in crockpot. No soda. Does have a sweet tooth though she tries to watch this. Exercises twice a week for 45 minutes doing strengthening and aerobic conditioning. Up-to-date on tetanus vaccination, zoster vaccination, pneumonia vaccination, and flu shot. Up-to-date on colonoscopy. Needs a mammogram. DEXA scan up-to-date. Revealed osteoporosis. She is hesitant to do any treatment for this as it was not beneficial in the past. She wants to know how this compares to her prior DEXA scans Patient is a former smoker. Smoked in her 60s and 30s 1 pack per day. Occasional alcohol use. No illicit drug use.  GERD: Occasionally gets burning reflux sensation if she is sitting while eating. Tums do help. She notes no blood in her stool. She is hesitant to go on any medicine for this or have an EGD prior to trying lifestyle changes.  Patient notes over the last 6 or so months she has noted a mild tremor when she's holding a coffee cup or holding a mirror when trying to look at her posterior head. No tremor at any other time. No other neurological issues.  Active Ambulatory Problems    Diagnosis Date Noted  . Depression 07/05/2012  . Personal history of breast cancer 07/05/2012  . Medicare annual wellness visit, subsequent 05/09/2013  . Osteoporosis 05/23/2014  . Hyperlipidemia 05/23/2014  . Obesity (BMI 30-39.9) 05/23/2014  . Abnormal EKG 05/31/2014  . Trigger finger, acquired 12/05/2015  . Encounter for general adult medical examination with abnormal findings 12/27/2016  . Tremor 12/27/2016  . GERD (gastroesophageal reflux disease) 12/27/2016   Resolved Ambulatory Problems    Diagnosis Date Noted  . Hyperlipidemia LDL goal < 100 07/05/2012  . Osteoporosis 07/05/2012  . Splitting of nail 05/09/2013  . Belching 05/09/2013  .  Hematuria 05/09/2013  . Trigger finger, acquired 05/09/2013  . Abdominal pain, left lower quadrant 11/12/2013  . Right shoulder pain 05/23/2014  . Subacromial bursitis 06/06/2014  . HLD (hyperlipidemia) 07/05/2012  . Cerumen impaction 11/25/2014  . Dysuria 11/25/2014  . Elevated BP 12/05/2015  . Dermatitis 12/05/2015   Past Medical History:  Diagnosis Date  . Bilateral dry eyes   . Cancer ([place]) 2000  . Heart murmur   . Hyperlipidemia   . Osteoporosis     Family History  Problem Relation Age of Onset  . Cancer Mother   . Heart disease Father   . Heart attack Father 38  . Cancer Maternal Aunt     Social History   Social History  . Marital status: Married    Spouse name: N/A  . Number of children: N/A  . Years of education: N/A   Occupational History  . Not on file.   Social History Main Topics  . Smoking status: Former Research scientist (life sciences)  . Smokeless tobacco: Not on file  . Alcohol use Yes     Comment: occasional  . Drug use: No  . Sexual activity: Not on file   Other Topics Concern  . Not on file   Social History Narrative   Lives in [place] with husband.      Diet - healthy diet   Exercise - [employer] weight training    ROS  General:  Negative for nexplained weight loss, fever Skin: Negative for new or changing mole, sore that won't heal HEENT: Negative for trouble hearing, trouble seeing, ringing  in ears, mouth sores, hoarseness, change in voice, dysphagia. CV:  Negative for chest pain, dyspnea, edema, palpitations Resp: Negative for cough, dyspnea, hemoptysis GI: Negative for nausea, vomiting, diarrhea, constipation, abdominal pain, melena, hematochezia. GU: Negative for dysuria, incontinence, urinary hesitance, hematuria, vaginal or penile discharge, polyuria, sexual difficulty, lumps in testicle or breasts MSK: Negative for muscle cramps or aches, joint pain or swelling Neuro: Negative for headaches, weakness, numbness, dizziness, passing  out/fainting Psych: Negative for depression, anxiety, memory problems  Objective  Physical Exam Vitals:   12/27/16 1050  BP: 124/80  Pulse: 92  Temp: 98.1 F (36.7 C)    BP Readings from Last 3 Encounters:  12/27/16 124/80  06/23/16 124/84  12/05/15 130/82   Wt Readings from Last 3 Encounters:  12/27/16 167 lb 12.8 oz (76.1 kg)  06/23/16 166 lb 12.8 oz (75.7 kg)  12/05/15 166 lb 12 oz (75.6 kg)    Physical Exam  Constitutional: She is well-developed, well-nourished, and in no distress.  HENT:  Head: Normocephalic and atraumatic.  Mouth/Throat: Oropharynx is clear and moist. No oropharyngeal exudate.  Eyes: Conjunctivae are normal. Pupils are equal, round, and reactive to light.  Cardiovascular: Normal rate and regular rhythm.   Pulmonary/Chest: Effort normal and breath sounds normal.  Abdominal: Soft. Bowel sounds are normal. She exhibits no distension. There is no tenderness. There is no rebound.  Genitourinary:  Genitourinary Comments: Left breast with chronic scar in the upper inner quadrant, and no other lesions noted on the skin of bilateral breasts, bilateral breasts are without masses or nipple inversion, no axillary masses bilaterally  Musculoskeletal: She exhibits no edema.  Neurological: She is alert. Gait normal.  Mild tremor noted while holding at a paper, CN 2-12 intact, 5/5 strength in bilateral biceps, triceps, grip, quads, hamstrings, plantar and dorsiflexion, sensation to light touch intact in bilateral UE and LE, normal gait, normal finger to nose  Skin: Skin is warm and dry.  Psychiatric: Mood and affect normal.     Assessment/Plan:   Encounter for general adult medical examination with abnormal findings Discussed diet and exercise. Blood pressure at goal. Vaccinations up-to-date. Colonoscopy up-to-date. Mammogram will be ordered. We will request records from her prior DEXA scans to determine if her osteoporosis has improved or worsened or stayed the  same. Lab work as outlined below.  Tremor Suspect essential tremor given that it only occurs with holding a cup or mirror at this time. Discussed continuing to monitor this and if it worsens or changes letting us know.  GERD (gastroesophageal reflux disease) Patient with infrequent symptoms. She wants to try wearing looser fitting clothes and I did discuss some dietary changes with her. If her symptoms become more persistent she will let us know so we can refer to GI for a EGD.   Orders Placed This Encounter  Procedures  . MM SCREENING BREAST TOMO BILATERAL    Standing Status:   Future    Standing Expiration Date:   02/24/2018    Order Specific Question:   Reason for Exam (SYMPTOM  OR DIAGNOSIS REQUIRED)    Answer:   breast cancer screening    Order Specific Question:   Preferred imaging location?    Answer:    [facility]  . CBC    Standing Status:   Future    Standing Expiration Date:   12/27/2017  . TSH    Standing Status:   Future    Standing Expiration Date:   12/27/2017  . Comp Met (CMET)  Standing Status:   Future    Standing Expiration Date:   12/27/2017  . Lipid Profile    Standing Status:   Future    Standing Expiration Date:   12/27/2017  . HgB A1c    Standing Status:   Future    Standing Expiration Date:   12/27/2017  . Magnesium    Standing Status:   Future    Standing Expiration Date:   12/27/2017    No orders of the defined types were placed in this encounter.    Tommi Rumps, MD [place]

## 2016-12-27 NOTE — Assessment & Plan Note (Signed)
Discussed diet and exercise. Blood pressure at goal. Vaccinations up-to-date. Colonoscopy up-to-date. Mammogram will be ordered. We will request records from her prior DEXA scans to determine if her osteoporosis has improved or worsened or stayed the same. Lab work as outlined below.

## 2017-01-07 ENCOUNTER — Other Ambulatory Visit: Payer: Medicare Other

## 2017-01-07 ENCOUNTER — Inpatient Hospital Stay
Admission: RE | Admit: 2017-01-07 | Discharge: 2017-01-07 | Disposition: A | Discharge status: Home / Self Care | Payer: Self-pay | Source: Ambulatory Visit | Attending: *Deleted | Admitting: *Deleted

## 2017-01-07 ENCOUNTER — Other Ambulatory Visit: Payer: Self-pay | Admitting: *Deleted

## 2017-01-07 DIAGNOSIS — Z9289 Personal history of other medical treatment: Secondary | ICD-10-CM

## 2017-01-12 ENCOUNTER — Other Ambulatory Visit (INDEPENDENT_AMBULATORY_CARE_PROVIDER_SITE_OTHER): Payer: Medicare Other

## 2017-01-12 DIAGNOSIS — Z13220 Encounter for screening for lipoid disorders: Secondary | ICD-10-CM | POA: Diagnosis not present

## 2017-01-12 DIAGNOSIS — Z130 Encounter for screening for diseases of the blood and blood-forming organs and certain disorders involving the immune mechanism: Secondary | ICD-10-CM | POA: Diagnosis not present

## 2017-01-12 DIAGNOSIS — Z1329 Encounter for screening for other suspected endocrine disorder: Secondary | ICD-10-CM | POA: Diagnosis not present

## 2017-01-12 DIAGNOSIS — Z0001 Encounter for general adult medical examination with abnormal findings: Secondary | ICD-10-CM

## 2017-01-12 LAB — COMPREHENSIVE METABOLIC PANEL
ALBUMIN: 4.2 g/dL (ref 3.5–5.2)
ALT: 22 U/L (ref 0–35)
AST: 25 U/L (ref 0–37)
Alkaline Phosphatase: 66 U/L (ref 39–117)
BILIRUBIN TOTAL: 0.6 mg/dL (ref 0.2–1.2)
BUN: 16 mg/dL (ref 6–23)
CALCIUM: 9.2 mg/dL (ref 8.4–10.5)
CO2: 30 meq/L (ref 19–32)
CREATININE: 0.84 mg/dL (ref 0.40–1.20)
Chloride: 105 mEq/L (ref 96–112)
GFR: 70.14 mL/min (ref >60.00)
Glucose, Bld: 100 mg/dL — ABNORMAL HIGH (ref 70–99)
Potassium: 3.8 mEq/L (ref 3.5–5.1)
Sodium: 140 mEq/L (ref 135–145)
Total Protein: 6.8 g/dL (ref 6.0–8.3)

## 2017-01-12 LAB — LIPID PANEL
CHOLESTEROL: 186 mg/dL (ref 0–200)
HDL: 72.7 mg/dL (ref >39.00)
LDL Cholesterol: 90 mg/dL (ref 0–99)
NonHDL: 113.11
TRIGLYCERIDES: 117 mg/dL (ref 0.0–149.0)
Total CHOL/HDL Ratio: 3
VLDL: 23.4 mg/dL (ref 0.0–40.0)

## 2017-01-12 LAB — CBC
HCT: 42.3 % (ref 36.0–46.0)
HEMOGLOBIN: 14.2 g/dL (ref 12.0–15.0)
MCHC: 33.5 g/dL (ref 30.0–36.0)
MCV: 83.2 fl (ref 78.0–100.0)
PLATELETS: 257 10*3/uL (ref 150.0–400.0)
RBC: 5.09 Mil/uL (ref 3.87–5.11)
RDW: 14.6 % (ref 11.5–15.5)
WBC: 6.9 10*3/uL (ref 4.0–10.5)

## 2017-01-12 LAB — HEMOGLOBIN A1C: Hgb A1c MFr Bld: 6 % (ref 4.6–6.5)

## 2017-01-12 LAB — MAGNESIUM: MAGNESIUM: 2.2 mg/dL (ref 1.5–2.5)

## 2017-01-12 LAB — TSH: TSH: 1.19 u[IU]/mL (ref 0.35–4.50)

## 2017-02-04 ENCOUNTER — Ambulatory Visit
Admission: RE | Admit: 2017-02-04 | Discharge: 2017-02-04 | Disposition: A | Discharge status: Home / Self Care | Payer: Medicare Other | Source: Ambulatory Visit | Attending: Family Medicine | Admitting: Family Medicine

## 2017-02-04 DIAGNOSIS — Z1239 Encounter for other screening for malignant neoplasm of breast: Secondary | ICD-10-CM

## 2017-02-04 DIAGNOSIS — Z1231 Encounter for screening mammogram for malignant neoplasm of breast: Secondary | ICD-10-CM | POA: Insufficient documentation

## 2017-02-04 HISTORY — DX: Personal history of irradiation: Z92.3

## 2017-06-27 ENCOUNTER — Encounter: Payer: Self-pay | Admitting: Family Medicine

## 2017-06-27 ENCOUNTER — Ambulatory Visit (INDEPENDENT_AMBULATORY_CARE_PROVIDER_SITE_OTHER): Payer: Medicare Other | Admitting: Family Medicine

## 2017-06-27 VITALS — BP 118/76 | HR 76 | Temp 98.4°F | Wt 166.4 lb

## 2017-06-27 DIAGNOSIS — E785 Hyperlipidemia, unspecified: Secondary | ICD-10-CM

## 2017-06-27 DIAGNOSIS — F325 Major depressive disorder, single episode, in full remission: Secondary | ICD-10-CM | POA: Diagnosis not present

## 2017-06-27 DIAGNOSIS — R229 Localized swelling, mass and lump, unspecified: Secondary | ICD-10-CM | POA: Diagnosis not present

## 2017-06-27 DIAGNOSIS — R7303 Prediabetes: Secondary | ICD-10-CM | POA: Diagnosis not present

## 2017-06-27 DIAGNOSIS — H6121 Impacted cerumen, right ear: Secondary | ICD-10-CM | POA: Diagnosis not present

## 2017-06-27 DIAGNOSIS — R251 Tremor, unspecified: Secondary | ICD-10-CM

## 2017-06-27 DIAGNOSIS — H6120 Impacted cerumen, unspecified ear: Secondary | ICD-10-CM | POA: Insufficient documentation

## 2017-06-27 LAB — POCT GLYCOSYLATED HEMOGLOBIN (HGB A1C): Hemoglobin A1C: 5.7

## 2017-06-27 MED ORDER — VENLAFAXINE HCL ER 150 MG PO CP24: 150.0000 mg | ORAL_CAPSULE | Freq: Every day | ORAL | 3 refills | Status: DC

## 2017-06-27 MED ORDER — SIMVASTATIN 20 MG PO TABS: 20.0000 mg | ORAL_TABLET | Freq: Every evening | ORAL | 3 refills | Status: DC

## 2017-06-27 NOTE — Progress Notes (Signed)
Tommi Rumps, MD Phone: [phone]  Stephanie Mora is a 76 y.o. female who presents today for f/u.  HYPERLIPIDEMIA Symptoms Chest pain on exertion:  no   Medications: Compliance- taking simvastatin Right upper quadrant pain- no  Muscle aches- no  Depression: Patient denies depression and anxiety. No SI or HI. Effexor is very healthy.  Tremor: Notes this really bothers her. Typically bothers her if she is trying to drink something and just a steady the, or if she is holding something and turned to do something with it. No resting tremor. No rigidity. No gait changes.  There was a skin lesion noted on exam in her left anterior lower extremity. It's been there 2-3 months. Has not healed. Does have a dermatologist. Patient additionally notes she scratched herself with a vine on the lateral aspect of her left lower leg about a week and a half ago. Notes it did become swollen and erythematous and warm initially though his improved quite a bit. There is some surrounding erythema though the warmth is gone and the swelling is improved. She's had reactions like this in the past that resolved on their own.  Prediabetes: No polyuria or polydipsia. Last A1c 6.0.   PMH: Former smoker   ROS see history of present illness  Objective  Physical Exam Vitals:   06/27/17 1047  BP: 118/76  Pulse: 76  Temp: 98.4 F (36.9 C)    BP Readings from Last 3 Encounters:  06/27/17 118/76  12/27/16 124/80  06/23/16 124/84   Wt Readings from Last 3 Encounters:  06/27/17 166 lb 6.4 oz (75.5 kg)  12/27/16 167 lb 12.8 oz (76.1 kg)  06/23/16 166 lb 12.8 oz (75.7 kg)    Physical Exam  Constitutional: No distress.  HENT:  TM clear, right TM obscured by cerumen that resolved after irrigation by CMA  Cardiovascular: Normal rate, regular rhythm and normal heart sounds.   Pulmonary/Chest: Effort normal and breath sounds normal.  Musculoskeletal:       Legs: Neurological: She is alert. Gait normal.   No tremor noted, no cogwheel rigidity  Skin: She is not diaphoretic.     Assessment/Plan: Please see individual problem list.  Depression Well-controlled. Continue current medication.  Hyperlipidemia Tolerating simvastatin. Continue this.  Pre-diabetes Last A1c 6.0. Check point-of-care A1c today.  Skin nodule Refer to dermatology for evaluation. Abrasion with scabs noted as well and lower extremity. Does have some surrounding erythema that she reports is quite a bit improved. Given improvement I doubt infection. She'll monitor and if it worsens in any manner she'll be reevaluated.  Tremor Minimal symptoms. Suspect essential tremor. Benign exam today. She'll monitor.  Cerumen impaction Resolved with irrigation. Monitor for recurrence.   Orders Placed This Encounter  Procedures  . Ambulatory referral to Dermatology    Referral Priority:   Routine    Referral Type:   Consultation    Referral Reason:   Specialty Services Required    Requested Specialty:   Dermatology    Number of Visits Requested:   1  . POCT HgB A1C    Meds ordered this encounter  Medications  . simvastatin (ZOCOR) 20 MG tablet    Sig: Take 1 tablet (20 mg total) by mouth every evening.    Dispense:  90 tablet    Refill:  3  . venlafaxine XR (EFFEXOR-XR) 150 MG 24 hr capsule    Sig: Take 1 capsule (150 mg total) by mouth daily.    Dispense:  90 capsule  Refill:  [place], MD [place]

## 2017-06-27 NOTE — Assessment & Plan Note (Signed)
Well controlled. Continue current medication.  

## 2017-06-27 NOTE — Assessment & Plan Note (Signed)
Resolved with irrigation. Monitor for recurrence.

## 2017-06-27 NOTE — Assessment & Plan Note (Signed)
Last A1c 6.0. Check point-of-care A1c today.

## 2017-06-27 NOTE — Assessment & Plan Note (Signed)
Refer to dermatology for evaluation. Abrasion with scabs noted as well and lower extremity. Does have some surrounding erythema that she reports is quite a bit improved. Given improvement I doubt infection. She'll monitor and if it worsens in any manner she'll be reevaluated.

## 2017-06-27 NOTE — Assessment & Plan Note (Signed)
Minimal symptoms. Suspect essential tremor. Benign exam today. She'll monitor.

## 2017-06-27 NOTE — Patient Instructions (Addendum)
Nice to see you. Please monitor your tremor. If it worsens please let us know. We'll get you to see dermatology. Please monitor the area where you cut your self with the vine. If it becomes more red, swells, becomes warm again, or changes in any manner other than improving please be evaluated immediately. Your refills have been given.

## 2017-06-27 NOTE — Assessment & Plan Note (Signed)
Tolerating simvastatin. Continue this.

## 2017-07-14 DIAGNOSIS — D485 Neoplasm of uncertain behavior of skin: Secondary | ICD-10-CM | POA: Diagnosis not present

## 2017-07-14 DIAGNOSIS — C44729 Squamous cell carcinoma of skin of left lower limb, including hip: Secondary | ICD-10-CM | POA: Diagnosis not present

## 2017-07-14 DIAGNOSIS — L821 Other seborrheic keratosis: Secondary | ICD-10-CM | POA: Diagnosis not present

## 2017-07-14 DIAGNOSIS — L981 Factitial dermatitis: Secondary | ICD-10-CM | POA: Diagnosis not present

## 2017-09-02 DIAGNOSIS — C44729 Squamous cell carcinoma of skin of left lower limb, including hip: Secondary | ICD-10-CM | POA: Diagnosis not present

## 2017-09-02 DIAGNOSIS — L905 Scar conditions and fibrosis of skin: Secondary | ICD-10-CM | POA: Diagnosis not present

## 2017-12-08 DIAGNOSIS — H2513 Age-related nuclear cataract, bilateral: Secondary | ICD-10-CM | POA: Diagnosis not present

## 2017-12-15 DIAGNOSIS — L821 Other seborrheic keratosis: Secondary | ICD-10-CM | POA: Diagnosis not present

## 2017-12-15 DIAGNOSIS — L981 Factitial dermatitis: Secondary | ICD-10-CM | POA: Diagnosis not present

## 2017-12-15 DIAGNOSIS — Z85828 Personal history of other malignant neoplasm of skin: Secondary | ICD-10-CM | POA: Diagnosis not present

## 2017-12-15 DIAGNOSIS — D1801 Hemangioma of skin and subcutaneous tissue: Secondary | ICD-10-CM | POA: Diagnosis not present

## 2017-12-29 ENCOUNTER — Other Ambulatory Visit: Payer: Self-pay

## 2017-12-29 ENCOUNTER — Encounter: Payer: Self-pay | Admitting: Family Medicine

## 2017-12-29 ENCOUNTER — Ambulatory Visit (INDEPENDENT_AMBULATORY_CARE_PROVIDER_SITE_OTHER): Payer: Medicare Other | Admitting: Family Medicine

## 2017-12-29 VITALS — BP 140/80 | HR 83 | Temp 97.6°F | Ht 61.5 in | Wt 167.6 lb

## 2017-12-29 DIAGNOSIS — H9193 Unspecified hearing loss, bilateral: Secondary | ICD-10-CM

## 2017-12-29 DIAGNOSIS — N393 Stress incontinence (female) (male): Secondary | ICD-10-CM

## 2017-12-29 DIAGNOSIS — H539 Unspecified visual disturbance: Secondary | ICD-10-CM | POA: Diagnosis not present

## 2017-12-29 DIAGNOSIS — M810 Age-related osteoporosis without current pathological fracture: Secondary | ICD-10-CM

## 2017-12-29 DIAGNOSIS — Z853 Personal history of malignant neoplasm of breast: Secondary | ICD-10-CM

## 2017-12-29 DIAGNOSIS — Z13220 Encounter for screening for lipoid disorders: Secondary | ICD-10-CM

## 2017-12-29 DIAGNOSIS — Z0001 Encounter for general adult medical examination with abnormal findings: Secondary | ICD-10-CM | POA: Diagnosis not present

## 2017-12-29 DIAGNOSIS — Z130 Encounter for screening for diseases of the blood and blood-forming organs and certain disorders involving the immune mechanism: Secondary | ICD-10-CM

## 2017-12-29 DIAGNOSIS — Z1329 Encounter for screening for other suspected endocrine disorder: Secondary | ICD-10-CM

## 2017-12-29 DIAGNOSIS — R7303 Prediabetes: Secondary | ICD-10-CM

## 2017-12-29 NOTE — Patient Instructions (Signed)
Nice to see you. Please work on diet and exercise. We will have you return for fasting lab work. We will get you set up for mammogram and DEXA scan.

## 2017-12-29 NOTE — Progress Notes (Addendum)
Tommi Rumps, MD Phone: [phone]  Stephanie Mora is a 77 y.o. female who presents today for physical exam.  Exercises for 45 minutes 2 days a week with cardio and lifting weights. Tries to eat healthfully.  Did not eat great over the holidays. Tetanus vaccination up-to-date. Does have history of osteoporosis.  Last DEXA scan 06/28/16.  She does take vitamin D supplementation.  Tries to get dietary calcium. No history of abnormal Pap smears. Reports a history of breast cancer.  It appears she is 18-19 years out from her diagnosis and treatment.  Mammograms have been negative.  She was discharged from oncology follow-up. Colonoscopy in 2015 with no polyps. Former smoker though quit 30 years ago.  Smoked about half a pack per day between ages 67 and 31.  2-3 alcoholic beverages a week.  No illicit drug use.  Does note some chronic stress incontinence.  This is not worse than usual.  She follows with ophthalmology for her trouble seeing.  She is considering whether or not to see audiology for her trouble hearing.   Active Ambulatory Problems    Diagnosis Date Noted  . Depression 07/05/2012  . Personal history of breast cancer 07/05/2012  . Medicare annual wellness visit, subsequent 05/09/2013  . Osteoporosis 05/23/2014  . Hyperlipidemia 05/23/2014  . Obesity (BMI 30-39.9) 05/23/2014  . Abnormal EKG 05/31/2014  . Trigger finger, acquired 12/05/2015  . Encounter for general adult medical examination with abnormal findings 12/27/2016  . Tremor 12/27/2016  . GERD (gastroesophageal reflux disease) 12/27/2016  . Pre-diabetes 06/27/2017  . Skin nodule 06/27/2017  . Cerumen impaction 06/27/2017  . Stress incontinence 12/31/2017  . Hearing difficulty 12/31/2017  . Changes in vision 12/31/2017   Resolved Ambulatory Problems    Diagnosis Date Noted  . Hyperlipidemia LDL goal < 100 07/05/2012  . Osteoporosis 07/05/2012  . Splitting of nail 05/09/2013  . Belching 05/09/2013  .  Hematuria 05/09/2013  . Trigger finger, acquired 05/09/2013  . Abdominal pain, left lower quadrant 11/12/2013  . Right shoulder pain 05/23/2014  . Subacromial bursitis 06/06/2014  . HLD (hyperlipidemia) 07/05/2012  . Cerumen impaction 11/25/2014  . Dysuria 11/25/2014  . Elevated BP 12/05/2015  . Dermatitis 12/05/2015   Past Medical History:  Diagnosis Date  . Bilateral dry eyes   . Cancer ([place]) 2000  . Heart murmur   . Hyperlipidemia   . Osteoporosis   . Personal history of radiation therapy     Family History  Problem Relation Age of Onset  . Cancer Mother   . Breast cancer Mother 72  . Heart disease Father   . Heart attack Father 82  . Cancer Maternal Aunt   . Breast cancer Maternal Aunt     Social History   Socioeconomic History  . Marital status: Married    Spouse name: Not on file  . Number of children: Not on file  . Years of education: Not on file  . Highest education level: Not on file  Social Needs  . Financial resource strain: Not on file  . Food insecurity - worry: Not on file  . Food insecurity - inability: Not on file  . Transportation needs - medical: Not on file  . Transportation needs - non-medical: Not on file  Occupational History  . Not on file  Tobacco Use  . Smoking status: Former Research scientist (life sciences)  . Smokeless tobacco: Never Used  Substance and Sexual Activity  . Alcohol use: Yes    Comment: occasional  . Drug use:  No  . Sexual activity: Not on file  Other Topics Concern  . Not on file  Social History Narrative   Lives in [place] with husband.      Diet - healthy diet   Exercise - [employer] weight training    ROS  General:  Negative for nexplained weight loss, fever Skin: Negative for new or changing mole, sore that won't heal HEENT: Positive for trouble hearing, trouble seeing, negative for ringing in ears, mouth sores, hoarseness, change in voice, dysphagia. CV:  Negative for chest pain, dyspnea, edema, palpitations Resp: Negative  for cough, dyspnea, hemoptysis GI: Negative for nausea, vomiting, diarrhea, constipation, abdominal pain, melena, hematochezia. GU: Positive for incontinence, negative for dysuria, urinary hesitance, hematuria, vaginal or penile discharge, polyuria, sexual difficulty, lumps in testicle or breasts MSK: Negative for muscle cramps or aches, joint pain or swelling Neuro: Negative for headaches, weakness, numbness, dizziness, passing out/fainting Psych: Negative for depression, anxiety, memory problems  Objective  Physical Exam Vitals:   12/29/17 1104  BP: 140/80  Pulse: 83  Temp: 97.6 F (36.4 C)  SpO2: 98%    BP Readings from Last 3 Encounters:  12/29/17 140/80  06/27/17 118/76  12/27/16 124/80   Wt Readings from Last 3 Encounters:  12/29/17 167 lb 9.6 oz (76 kg)  06/27/17 166 lb 6.4 oz (75.5 kg)  12/27/16 167 lb 12.8 oz (76.1 kg)    Physical Exam  Constitutional: No distress.  HENT:  Head: Normocephalic and atraumatic.  Mouth/Throat: Oropharynx is clear and moist. No oropharyngeal exudate.  Cerumen in right ear canal, this was irrigated by CMA revealing a normal right TM  Eyes: Conjunctivae are normal. Pupils are equal, round, and reactive to light.  Neck: Neck supple.  Cardiovascular: Normal rate, regular rhythm and normal heart sounds.  Pulmonary/Chest: Effort normal and breath sounds normal.  Abdominal: Soft. Bowel sounds are normal. She exhibits no distension. There is no tenderness.  Genitourinary:  Genitourinary Comments: No palpable abnormalities on breast exam, no skin changes, no nipple inversion, no axillary masses  Musculoskeletal: She exhibits no edema.  Lymphadenopathy:    She has no cervical adenopathy.  Neurological: She is alert. Gait normal.  Skin: Skin is warm and dry. She is not diaphoretic.     Assessment/Plan:   Encounter for general adult medical examination with abnormal findings Physical exam completed.  Encourage diet and exercise.  DEXA  scan up-to-date noted after patient left office.  Plan to discuss treatment with patient.  Mammogram ordered.  Colonoscopy up-to-date.  Lab work as outlined below.  Stress incontinence Encouraged Kegel exercises.  Hearing difficulty Offered audiology and/or  ENT referral.  Right ear was irrigated due to cerumen impaction revealing normal TM.  Normal left TM.  Changes in vision Follow-up with her eye doctor.   Orders Placed This Encounter  Procedures  . MM SCREENING BREAST TOMO BILATERAL    Standing Status:   Future    Standing Expiration Date:   02/27/2019    Order Specific Question:   Reason for Exam (SYMPTOM  OR DIAGNOSIS REQUIRED)    Answer:   screening mammogram, history right breast cancer    Order Specific Question:   Preferred imaging location?    Answer:   [facility]  . CBC w/Diff    Standing Status:   Future    Standing Expiration Date:   12/29/2018  . Comp Met (CMET)    Standing Status:   Future    Standing Expiration Date:   12/29/2018  .  HgB A1c    Standing Status:   Future    Standing Expiration Date:   12/29/2018  . Lipid panel    Standing Status:   Future    Standing Expiration Date:   12/29/2018  . TSH    Standing Status:   Future    Standing Expiration Date:   12/29/2018    No orders of the defined types were placed in this encounter.    Tommi Rumps, MD [place]

## 2017-12-30 ENCOUNTER — Encounter: Payer: Self-pay | Admitting: Family Medicine

## 2017-12-31 DIAGNOSIS — N393 Stress incontinence (female) (male): Secondary | ICD-10-CM | POA: Insufficient documentation

## 2017-12-31 DIAGNOSIS — H539 Unspecified visual disturbance: Secondary | ICD-10-CM | POA: Insufficient documentation

## 2017-12-31 DIAGNOSIS — H9190 Unspecified hearing loss, unspecified ear: Secondary | ICD-10-CM | POA: Insufficient documentation

## 2017-12-31 NOTE — Addendum Note (Signed)
Addended by: Leone Haven on: 12/31/2017 03:24 PM   Modules accepted: Orders

## 2017-12-31 NOTE — Assessment & Plan Note (Signed)
Follow-up with her eye doctor.

## 2017-12-31 NOTE — Assessment & Plan Note (Signed)
Encouraged Kegel exercises. 

## 2017-12-31 NOTE — Assessment & Plan Note (Addendum)
Physical exam completed.  Encourage diet and exercise.  DEXA scan up-to-date noted after patient left office.  Plan to discuss treatment with patient.  Mammogram ordered.  Colonoscopy up-to-date.  Lab work as outlined below.

## 2017-12-31 NOTE — Assessment & Plan Note (Signed)
Offered audiology and/or  ENT referral.  Right ear was irrigated due to cerumen impaction revealing normal TM.  Normal left TM.

## 2018-01-08 ENCOUNTER — Encounter: Payer: Self-pay | Admitting: Family Medicine

## 2018-01-10 NOTE — Telephone Encounter (Signed)
Melissa, can you check into the patients mammogram?  Thanks.  Kasandra Fehr.

## 2018-01-13 ENCOUNTER — Other Ambulatory Visit (INDEPENDENT_AMBULATORY_CARE_PROVIDER_SITE_OTHER): Payer: Medicare Other

## 2018-01-13 DIAGNOSIS — Z130 Encounter for screening for diseases of the blood and blood-forming organs and certain disorders involving the immune mechanism: Secondary | ICD-10-CM | POA: Diagnosis not present

## 2018-01-13 DIAGNOSIS — Z13220 Encounter for screening for lipoid disorders: Secondary | ICD-10-CM | POA: Diagnosis not present

## 2018-01-13 DIAGNOSIS — Z1329 Encounter for screening for other suspected endocrine disorder: Secondary | ICD-10-CM | POA: Diagnosis not present

## 2018-01-13 DIAGNOSIS — R7303 Prediabetes: Secondary | ICD-10-CM

## 2018-01-13 LAB — LIPID PANEL
CHOLESTEROL: 166 mg/dL (ref 0–200)
HDL: 65.6 mg/dL (ref >39.00)
LDL CALC: 76 mg/dL (ref 0–99)
NonHDL: 100.24
TRIGLYCERIDES: 120 mg/dL (ref 0.0–149.0)
Total CHOL/HDL Ratio: 3
VLDL: 24 mg/dL (ref 0.0–40.0)

## 2018-01-13 LAB — COMPREHENSIVE METABOLIC PANEL
ALBUMIN: 4 g/dL (ref 3.5–5.2)
ALK PHOS: 65 U/L (ref 39–117)
ALT: 22 U/L (ref 0–35)
AST: 25 U/L (ref 0–37)
BUN: 16 mg/dL (ref 6–23)
CALCIUM: 8.8 mg/dL (ref 8.4–10.5)
CO2: 28 mEq/L (ref 19–32)
Chloride: 103 mEq/L (ref 96–112)
Creatinine, Ser: 0.66 mg/dL (ref 0.40–1.20)
GFR: 92.4 mL/min (ref >60.00)
Glucose, Bld: 95 mg/dL (ref 70–99)
POTASSIUM: 3.9 meq/L (ref 3.5–5.1)
Sodium: 139 mEq/L (ref 135–145)
TOTAL PROTEIN: 6.9 g/dL (ref 6.0–8.3)
Total Bilirubin: 0.6 mg/dL (ref 0.2–1.2)

## 2018-01-13 LAB — CBC WITH DIFFERENTIAL/PLATELET
Basophils Absolute: 0.1 10*3/uL (ref 0.0–0.1)
Basophils Relative: 1 % (ref 0.0–3.0)
EOS PCT: 4.2 % (ref 0.0–5.0)
Eosinophils Absolute: 0.3 10*3/uL (ref 0.0–0.7)
HCT: 41.5 % (ref 36.0–46.0)
HEMOGLOBIN: 13.6 g/dL (ref 12.0–15.0)
LYMPHS PCT: 30.4 % (ref 12.0–46.0)
Lymphs Abs: 2 10*3/uL (ref 0.7–4.0)
MCHC: 32.7 g/dL (ref 30.0–36.0)
MCV: 85.6 fl (ref 78.0–100.0)
MONO ABS: 0.5 10*3/uL (ref 0.1–1.0)
MONOS PCT: 7.4 % (ref 3.0–12.0)
Neutro Abs: 3.8 10*3/uL (ref 1.4–7.7)
Neutrophils Relative %: 57 % (ref 43.0–77.0)
Platelets: 250 10*3/uL (ref 150.0–400.0)
RBC: 4.84 Mil/uL (ref 3.87–5.11)
RDW: 15.2 % (ref 11.5–15.5)
WBC: 6.7 10*3/uL (ref 4.0–10.5)

## 2018-01-13 LAB — TSH: TSH: 1 u[IU]/mL (ref 0.35–4.50)

## 2018-01-13 LAB — HEMOGLOBIN A1C: Hgb A1c MFr Bld: 6.1 % (ref 4.6–6.5)

## 2018-04-06 ENCOUNTER — Ambulatory Visit
Admission: RE | Admit: 2018-04-06 | Discharge: 2018-04-06 | Disposition: A | Discharge status: Home / Self Care | Payer: Medicare Other | Source: Ambulatory Visit | Attending: Family Medicine | Admitting: Family Medicine

## 2018-04-06 DIAGNOSIS — Z853 Personal history of malignant neoplasm of breast: Secondary | ICD-10-CM

## 2018-04-06 DIAGNOSIS — Z1231 Encounter for screening mammogram for malignant neoplasm of breast: Secondary | ICD-10-CM | POA: Diagnosis not present

## 2018-04-06 HISTORY — DX: Malignant neoplasm of unspecified site of unspecified female breast: C50.919

## 2018-06-19 ENCOUNTER — Other Ambulatory Visit: Payer: Self-pay | Admitting: Family Medicine

## 2018-06-29 ENCOUNTER — Ambulatory Visit: Payer: Medicare Other | Admitting: Family Medicine

## 2018-07-28 ENCOUNTER — Ambulatory Visit: Payer: Medicare Other | Admitting: Family Medicine

## 2018-07-28 ENCOUNTER — Encounter: Payer: Self-pay | Admitting: Family Medicine

## 2018-07-28 VITALS — BP 118/76 | HR 81 | Temp 98.0°F | Ht 62.0 in | Wt 166.0 lb

## 2018-07-28 DIAGNOSIS — R7303 Prediabetes: Secondary | ICD-10-CM

## 2018-07-28 DIAGNOSIS — R21 Rash and other nonspecific skin eruption: Secondary | ICD-10-CM | POA: Insufficient documentation

## 2018-07-28 DIAGNOSIS — E785 Hyperlipidemia, unspecified: Secondary | ICD-10-CM | POA: Diagnosis not present

## 2018-07-28 DIAGNOSIS — M810 Age-related osteoporosis without current pathological fracture: Secondary | ICD-10-CM | POA: Diagnosis not present

## 2018-07-28 LAB — HEMOGLOBIN A1C: HEMOGLOBIN A1C: 6 % (ref 4.6–6.5)

## 2018-07-28 LAB — HEPATIC FUNCTION PANEL
ALK PHOS: 66 U/L (ref 39–117)
ALT: 18 U/L (ref 0–35)
AST: 24 U/L (ref 0–37)
Albumin: 4.1 g/dL (ref 3.5–5.2)
Bilirubin, Direct: 0.1 mg/dL (ref 0.0–0.3)
Total Bilirubin: 0.5 mg/dL (ref 0.2–1.2)
Total Protein: 7 g/dL (ref 6.0–8.3)

## 2018-07-28 LAB — VITAMIN D 25 HYDROXY (VIT D DEFICIENCY, FRACTURES): VITD: 37 ng/mL (ref 30.00–100.00)

## 2018-07-28 NOTE — Assessment & Plan Note (Signed)
Undetermined cause.  Discussed topical antibiotic ointment.  She will follow-up with her dermatologist regarding this.

## 2018-07-28 NOTE — Patient Instructions (Signed)
Nice to see you. We will check some lab work today and contact you with the results. We will get a bone density scan scheduled for you. Please follow-up with your dermatologist.

## 2018-07-28 NOTE — Assessment & Plan Note (Signed)
Check hepatic function panel. 

## 2018-07-28 NOTE — Progress Notes (Signed)
  Tommi Rumps, MD Phone: [phone]  Stephanie Mora is a 77 y.o. female who presents today for f/u.  CC: osteoporosis, prediabetes, skin lesions  Osteoporosis: Patient is due for DEXA scan.  Does take calcium and vitamin D.  Takes 2000 units daily vitamin D.  Was on Reclast previously.  Does have a history of ankle fracture.  Prediabetes: No polyuria or polydipsia.  She has cut back on sugar.  She is going to the gym 2 times a week.  Patient reports she has had some red bumps pop up on the back of her neck bilaterally.  She will scratch at them and then they will scab.  No drainage.  They do itch some.  It is along her hairline.  None into her hair or scalp.  Has been going on 6 weeks.  She tried Vaseline and hydrocortisone.  Social History   Tobacco Use  Smoking Status Former Smoker  Smokeless Tobacco Never Used     ROS see history of present illness  Objective  Physical Exam Vitals:   07/28/18 1129  BP: 118/76  Pulse: 81  Temp: 98 F (36.7 C)  SpO2: 97%    BP Readings from Last 3 Encounters:  07/28/18 118/76  12/29/17 140/80  06/27/17 118/76   Wt Readings from Last 3 Encounters:  07/28/18 166 lb (75.3 kg)  12/29/17 167 lb 9.6 oz (76 kg)  06/27/17 166 lb 6.4 oz (75.5 kg)    Physical Exam  Constitutional: No distress.  Cardiovascular: Normal rate, regular rhythm and normal heart sounds.  Pulmonary/Chest: Effort normal and breath sounds normal.  Musculoskeletal: She exhibits no edema.  Neurological: She is alert.  Skin: Skin is warm and dry. She is not diaphoretic.  Areas of scabbing and scattered erythematous papules noted over her posterior hairline on her neck, they are nontender, there is no surrounding erythema, no induration or warmth     Assessment/Plan: Please see individual problem list.  Osteoporosis DEXA scan ordered.  Continue vitamin D and calcium.  Check vitamin D level.  Pre-diabetes Check A1c.  Continue diet and  exercise.  Rash Undetermined cause.  Discussed topical antibiotic ointment.  She will follow-up with her dermatologist regarding this.  Hyperlipidemia Check hepatic function panel.   Orders Placed This Encounter  Procedures  . DG Bone Density    Standing Status:   Future    Standing Expiration Date:   09/28/2019    Order Specific Question:   Reason for Exam (SYMPTOM  OR DIAGNOSIS REQUIRED)    Answer:   osteoporososis follow-up    Order Specific Question:   Preferred imaging location?    Answer:   [facility]  . Vitamin D (25 hydroxy)  . HgB A1c  . Hepatic function panel    No orders of the defined types were placed in this encounter.    Tommi Rumps, MD [place]

## 2018-07-28 NOTE — Assessment & Plan Note (Signed)
DEXA scan ordered.  Continue vitamin D and calcium.  Check vitamin D level.

## 2018-07-28 NOTE — Assessment & Plan Note (Signed)
Check A1c.  Continue diet and exercise. 

## 2018-08-22 DIAGNOSIS — L981 Factitial dermatitis: Secondary | ICD-10-CM | POA: Diagnosis not present

## 2018-09-15 ENCOUNTER — Encounter: Payer: Self-pay | Admitting: Family Medicine

## 2018-09-22 ENCOUNTER — Other Ambulatory Visit: Payer: Self-pay | Admitting: Family Medicine

## 2018-09-22 DIAGNOSIS — M810 Age-related osteoporosis without current pathological fracture: Secondary | ICD-10-CM | POA: Diagnosis not present

## 2018-10-01 ENCOUNTER — Telehealth: Payer: Self-pay | Admitting: Family Medicine

## 2018-10-01 NOTE — Telephone Encounter (Signed)
Please let the patient know her bone density scan revealed osteoporosis.  I would suggest treatment for this.  It appears she was on Reclast previously.  She would like to consider that again we would need to refer her to rheumatology.  Thanks.

## 2018-10-02 NOTE — Telephone Encounter (Signed)
Called pt and left VM to call back. CRM created and sent to [facility] pool.

## 2018-10-03 NOTE — Telephone Encounter (Signed)
It states there is no significant change from prior.  If she would like to consider treatment we can consider this at her next visit.

## 2018-10-03 NOTE — Telephone Encounter (Signed)
Called and spoke with pt. Pt advised and voiced understanding. Pt stated that she wanted to know if how much results have changed compare to her last BD. Pt stated that if there was NOT much of a change she does not feel that a referral will be needed.   Sent to PCP   Pt stated that this can also be discussed at her next OV

## 2018-10-03 NOTE — Telephone Encounter (Signed)
Called and spoke with pt. Pt advised and will wait to discuss this at her next OV.

## 2018-10-26 ENCOUNTER — Ambulatory Visit (INDEPENDENT_AMBULATORY_CARE_PROVIDER_SITE_OTHER): Payer: Medicare Other

## 2018-10-26 DIAGNOSIS — Z23 Encounter for immunization: Secondary | ICD-10-CM

## 2018-11-13 ENCOUNTER — Other Ambulatory Visit: Payer: Self-pay | Admitting: Family Medicine

## 2018-12-25 ENCOUNTER — Other Ambulatory Visit: Payer: Self-pay | Admitting: Family Medicine

## 2018-12-28 DIAGNOSIS — Z08 Encounter for follow-up examination after completed treatment for malignant neoplasm: Secondary | ICD-10-CM | POA: Diagnosis not present

## 2018-12-28 DIAGNOSIS — L981 Factitial dermatitis: Secondary | ICD-10-CM | POA: Diagnosis not present

## 2018-12-28 DIAGNOSIS — Z85828 Personal history of other malignant neoplasm of skin: Secondary | ICD-10-CM | POA: Diagnosis not present

## 2018-12-28 DIAGNOSIS — L821 Other seborrheic keratosis: Secondary | ICD-10-CM | POA: Diagnosis not present

## 2019-01-29 ENCOUNTER — Ambulatory Visit (INDEPENDENT_AMBULATORY_CARE_PROVIDER_SITE_OTHER): Payer: Medicare Other | Admitting: Family Medicine

## 2019-01-29 ENCOUNTER — Telehealth: Payer: Self-pay | Admitting: Family Medicine

## 2019-01-29 ENCOUNTER — Encounter: Payer: Self-pay | Admitting: Family Medicine

## 2019-01-29 VITALS — BP 118/78 | HR 77 | Temp 97.7°F | Ht 62.0 in | Wt 163.8 lb

## 2019-01-29 DIAGNOSIS — Z1329 Encounter for screening for other suspected endocrine disorder: Secondary | ICD-10-CM | POA: Diagnosis not present

## 2019-01-29 DIAGNOSIS — R1310 Dysphagia, unspecified: Secondary | ICD-10-CM | POA: Insufficient documentation

## 2019-01-29 DIAGNOSIS — E785 Hyperlipidemia, unspecified: Secondary | ICD-10-CM | POA: Diagnosis not present

## 2019-01-29 DIAGNOSIS — Z130 Encounter for screening for diseases of the blood and blood-forming organs and certain disorders involving the immune mechanism: Secondary | ICD-10-CM

## 2019-01-29 DIAGNOSIS — Z0001 Encounter for general adult medical examination with abnormal findings: Secondary | ICD-10-CM

## 2019-01-29 DIAGNOSIS — M810 Age-related osteoporosis without current pathological fracture: Secondary | ICD-10-CM | POA: Diagnosis not present

## 2019-01-29 DIAGNOSIS — R7303 Prediabetes: Secondary | ICD-10-CM | POA: Diagnosis not present

## 2019-01-29 DIAGNOSIS — N649 Disorder of breast, unspecified: Secondary | ICD-10-CM

## 2019-01-29 DIAGNOSIS — Z853 Personal history of malignant neoplasm of breast: Secondary | ICD-10-CM | POA: Diagnosis not present

## 2019-01-29 LAB — LIPID PANEL
CHOL/HDL RATIO: 3
Cholesterol: 174 mg/dL (ref 0–200)
HDL: 67.4 mg/dL (ref >39.00)
LDL CALC: 81 mg/dL (ref 0–99)
NONHDL: 106.4
TRIGLYCERIDES: 128 mg/dL (ref 0.0–149.0)
VLDL: 25.6 mg/dL (ref 0.0–40.0)

## 2019-01-29 LAB — COMPREHENSIVE METABOLIC PANEL
ALT: 21 U/L (ref 0–35)
AST: 25 U/L (ref 0–37)
Albumin: 4.2 g/dL (ref 3.5–5.2)
Alkaline Phosphatase: 72 U/L (ref 39–117)
BUN: 19 mg/dL (ref 6–23)
CALCIUM: 9.3 mg/dL (ref 8.4–10.5)
CHLORIDE: 105 meq/L (ref 96–112)
CO2: 29 meq/L (ref 19–32)
CREATININE: 0.72 mg/dL (ref 0.40–1.20)
GFR: 78.41 mL/min (ref >60.00)
GLUCOSE: 89 mg/dL (ref 70–99)
Potassium: 4.5 mEq/L (ref 3.5–5.1)
Sodium: 142 mEq/L (ref 135–145)
Total Bilirubin: 0.4 mg/dL (ref 0.2–1.2)
Total Protein: 6.8 g/dL (ref 6.0–8.3)

## 2019-01-29 LAB — CBC
HCT: 42.8 % (ref 36.0–46.0)
Hemoglobin: 14 g/dL (ref 12.0–15.0)
MCHC: 32.7 g/dL (ref 30.0–36.0)
MCV: 85.8 fl (ref 78.0–100.0)
Platelets: 254 10*3/uL (ref 150.0–400.0)
RBC: 4.99 Mil/uL (ref 3.87–5.11)
RDW: 15.3 % (ref 11.5–15.5)
WBC: 7.5 10*3/uL (ref 4.0–10.5)

## 2019-01-29 LAB — HEMOGLOBIN A1C: HEMOGLOBIN A1C: 6 % (ref 4.6–6.5)

## 2019-01-29 LAB — VITAMIN D 25 HYDROXY (VIT D DEFICIENCY, FRACTURES): VITD: 44.49 ng/mL (ref 30.00–100.00)

## 2019-01-29 LAB — TSH: TSH: 1.04 u[IU]/mL (ref 0.35–4.50)

## 2019-01-29 NOTE — Telephone Encounter (Signed)
calle dpt and left a detailed Vm advised pt to call back to schedule for 2 week follow up.   CRM created and sent to [facility] pool.

## 2019-01-29 NOTE — Assessment & Plan Note (Signed)
Physical exam completed.  Diagnostic mammogram ordered of right breast with ultrasound.  Discussed this with our referral coordinator who will get this scheduled.  Discussed the patient's diagnosis of osteoporosis.  Her DEXA scan appears relatively stable.  She is hesitant to start on medication.  She was given information regarding Prolia as an option.  Oral bisphosphonates would not be an option.  Discussed getting Shingrix at her pharmacy.  Discussed continued diet changes and exercise.  Lab work as outlined below.

## 2019-01-29 NOTE — Telephone Encounter (Signed)
Please contact the patient and let her know that I would like for her to come back in about 2 weeks for [place] to recheck the breast lesion.  She could be scheduled for a 430 appointment on a Monday or a Wednesday or a 315 appointment.  Thanks.

## 2019-01-29 NOTE — Assessment & Plan Note (Signed)
Occasional symptoms of this.  Discussed referral to GI.  If she is not contacted in the next week or so she will contact us.

## 2019-01-29 NOTE — Assessment & Plan Note (Addendum)
Recommend diagnostic mammogram and ultrasound of the right breast.  Discussed that she would be due for her yearly mammogram in April.  I will have the CMA contact the patient to see if we can get her to come back in 2 weeks for recheck.

## 2019-01-29 NOTE — Progress Notes (Signed)
Tommi Rumps, MD Phone: [phone]  Stephanie Mora is a 78 y.o. female who presents today for cpe.  Exercises twice weekly by going to the gym lifting weights and doing cardio. Diet has been a little difficult with the sweets.  She is trying to do better. DEXA scan up-to-date.  Has osteoporosis.  She reports being treated with multiple things in the past including Reclast. She will be due for her mammogram in the near future.  She does have a history of breast cancer diagnosed in 2000.  She has had no issues. Colonoscopy 08/06/2014 with 5-year recall. Tetanus vaccine, pneumonia vaccine, and flu vaccine up-to-date.  Due for Shingrix. Quit smoking 30 years ago.  Alcohol use a couple times monthly.  No illicit drug use. Sees a dentist twice yearly.  Sees ophthalmology in March. Reports family history of breast cancer in her mother and paternal aunt.  No family history of ovarian cancer or colon cancer.  No vaginal bleeding.  Reports issues with feeling as though food is sticking in her lower chest when she swallows.  This occurs intermittently and does not occur very frequently.  She has rare reflux symptoms.  Has not seen GI for this.  She reports mild memory issues with her noticing that her memory is not as sharp as it was previously.  This has not interfered with her life.  She sometimes forgets where she puts things.  She also reports mild hearing issues which are not interfering with her life.  Active Ambulatory Problems    Diagnosis Date Noted  . Depression 07/05/2012  . Personal history of breast cancer 07/05/2012  . Medicare annual wellness visit, subsequent 05/09/2013  . Osteoporosis 05/23/2014  . Hyperlipidemia 05/23/2014  . Obesity (BMI 30-39.9) 05/23/2014  . Abnormal EKG 05/31/2014  . Trigger finger, acquired 12/05/2015  . Encounter for general adult medical examination with abnormal findings 12/27/2016  . Tremor 12/27/2016  . GERD (gastroesophageal reflux disease)  12/27/2016  . Pre-diabetes 06/27/2017  . Skin nodule 06/27/2017  . Stress incontinence 12/31/2017  . Hearing difficulty 12/31/2017  . Changes in vision 12/31/2017  . Rash 07/28/2018  . Dysphagia 01/29/2019   Resolved Ambulatory Problems    Diagnosis Date Noted  . Hyperlipidemia LDL goal < 100 07/05/2012  . Osteoporosis 07/05/2012  . Splitting of nail 05/09/2013  . Belching 05/09/2013  . Hematuria 05/09/2013  . Trigger finger, acquired 05/09/2013  . Abdominal pain, left lower quadrant 11/12/2013  . Right shoulder pain 05/23/2014  . Subacromial bursitis 06/06/2014  . HLD (hyperlipidemia) 07/05/2012  . Cerumen impaction 11/25/2014  . Dysuria 11/25/2014  . Elevated BP 12/05/2015  . Dermatitis 12/05/2015  . Cerumen impaction 06/27/2017   Past Medical History:  Diagnosis Date  . Bilateral dry eyes   . Breast cancer ([place]) 2000  . Heart murmur   . Personal history of radiation therapy 2000    Family History  Problem Relation Age of Onset  . Cancer Mother   . Breast cancer Mother 29  . Heart disease Father   . Heart attack Father 36  . Cancer Maternal Aunt   . Breast cancer Maternal Aunt     Social History   Socioeconomic History  . Marital status: Married    Spouse name: Not on file  . Number of children: Not on file  . Years of education: Not on file  . Highest education level: Not on file  Occupational History  . Not on file  Social Needs  . Emergency planning/management officer  strain: Not on file  . Food insecurity:    Worry: Not on file    Inability: Not on file  . Transportation needs:    Medical: Not on file    Non-medical: Not on file  Tobacco Use  . Smoking status: Former Research scientist (life sciences)  . Smokeless tobacco: Never Used  Substance and Sexual Activity  . Alcohol use: Yes    Comment: occasional  . Drug use: No  . Sexual activity: Not on file  Lifestyle  . Physical activity:    Days per week: Not on file    Minutes per session: Not on file  . Stress: Not on file    Relationships  . Social connections:    Talks on phone: Not on file    Gets together: Not on file    Attends religious service: Not on file    Active member of club or organization: Not on file    Attends meetings of clubs or organizations: Not on file    Relationship status: Not on file  . Intimate partner violence:    Fear of current or ex partner: Not on file    Emotionally abused: Not on file    Physically abused: Not on file    Forced sexual activity: Not on file  Other Topics Concern  . Not on file  Social History Narrative   Lives in [place] with husband.      Diet - healthy diet   Exercise - [employer] weight training    ROS  General:  Negative for nexplained weight loss, fever Skin: Negative for new or changing mole, sore that won't heal HEENT: Positive for trouble hearing, trouble seeing, negative for ringing in ears, mouth sores, hoarseness, change in voice, dysphagia. CV:  Negative for chest pain, dyspnea, edema, palpitations Resp: Negative for cough, dyspnea, hemoptysis GI: Positive for dysphagia, acid reflux, negative for nausea, vomiting, diarrhea, constipation, abdominal pain, melena, hematochezia. GU: Positive for stress incontinence, negative for dysuria, urinary hesitance, hematuria, vaginal or penile discharge, polyuria, sexual difficulty, lumps in testicle or breasts MSK: Negative for muscle cramps or aches, joint pain or swelling Neuro: Positive for chronic slight hand tremor, negative for headaches, weakness, numbness, dizziness, passing out/fainting Psych: Negative for depression, anxiety, positive for memory problems  Objective  Physical Exam Vitals:   01/29/19 0850  BP: 118/78  Pulse: 77  Temp: 97.7 F (36.5 C)  SpO2: 97%    BP Readings from Last 3 Encounters:  01/29/19 118/78  07/28/18 118/76  12/29/17 140/80   Wt Readings from Last 3 Encounters:  01/29/19 163 lb 12.8 oz (74.3 kg)  07/28/18 166 lb (75.3 kg)  12/29/17 167 lb 9.6 oz  (76 kg)    Physical Exam Constitutional:      General: She is not in acute distress.    Appearance: She is not diaphoretic.  HENT:     Head: Normocephalic and atraumatic.     Mouth/Throat:     Mouth: Mucous membranes are moist.     Pharynx: Oropharynx is clear.  Eyes:     Conjunctiva/sclera: Conjunctivae normal.     Pupils: Pupils are equal, round, and reactive to light.  Cardiovascular:     Rate and Rhythm: Normal rate and regular rhythm.     Heart sounds: Normal heart sounds.  Pulmonary:     Effort: Pulmonary effort is normal.     Breath sounds: Normal breath sounds.  Chest:       Comments: Scar noted from prior lumpectomy  as outlined above, there is a small pea-sized nodule noted within this area that is nontender, there are no other masses or lesions in the right breast, no nipple inversion, no skin changes, no right axillary masses, left breast with no tenderness, masses, nipple inversion, or skin changes, no left axillary masses Abdominal:     General: Bowel sounds are normal. There is no distension.     Palpations: Abdomen is soft.     Tenderness: There is no abdominal tenderness. There is no guarding or rebound.  Musculoskeletal:     Right lower leg: No edema.     Left lower leg: No edema.  Skin:    General: Skin is warm and dry.  Neurological:     Mental Status: She is alert.  Psychiatric:        Mood and Affect: Mood normal.      Assessment/Plan:   Encounter for general adult medical examination with abnormal findings Physical exam completed.  Diagnostic mammogram ordered of right breast with ultrasound.  Discussed this with our referral coordinator who will get this scheduled.  Discussed the patient's diagnosis of osteoporosis.  Her DEXA scan appears relatively stable.  She is hesitant to start on medication.  She was given information regarding Prolia as an option.  Oral bisphosphonates would not be an option.  Discussed getting Shingrix at her pharmacy.   Discussed continued diet changes and exercise.  Lab work as outlined below.  Dysphagia Occasional symptoms of this.  Discussed referral to GI.  If she is not contacted in the next week or so she will contact us.  Personal history of breast cancer Recommend diagnostic mammogram and ultrasound of the right breast.  Discussed that she would be due for her yearly mammogram in April.   Orders Placed This Encounter  Procedures  . US BREAST LTD UNI RIGHT INC AXILLA    Standing Status:   Future    Standing Expiration Date:   03/29/2020    Order Specific Question:   Reason for Exam (SYMPTOM  OR DIAGNOSIS REQUIRED)    Answer:   history breast cancer, small palpable lesion noted at the scar from prior lumpectomy in right breast    Order Specific Question:   Preferred imaging location?    Answer:   [facility]  . MM DIAG BREAST TOMO UNI RIGHT    Standing Status:   Future    Standing Expiration Date:   03/29/2020    Order Specific Question:   Reason for Exam (SYMPTOM  OR DIAGNOSIS REQUIRED)    Answer:   history of breast cancer, small nodule palpated within her scar site from lumpectomy at 9 oclock position    Order Specific Question:   Preferred imaging location?    Answer:   [facility]  . Lipid panel  . Comp Met (CMET)  . Vitamin D (25 hydroxy)  . HgB A1c  . TSH  . CBC  . Ambulatory referral to Gastroenterology    Referral Priority:   Routine    Referral Type:   Consultation    Referral Reason:   Specialty Services Required    Number of Visits Requested:   1    No orders of the defined types were placed in this encounter.    Tommi Rumps, MD [place]

## 2019-01-29 NOTE — Patient Instructions (Signed)
Nice to see you. Please continue with diet and exercise. If you would like to do treatment for your osteoporosis and would like to consider Prolia please let us know. We will get you set up with GI and for a mammogram. Please check with your pharmacy regarding Shingrix.

## 2019-01-30 NOTE — Telephone Encounter (Signed)
Patient returning [facility] phone call. No available appointment's in 2 weeks. Please advise. Patient will need to be fit in on the schedule for this appointment. States that she will be out of the house from 12p-2:30p.

## 2019-01-31 NOTE — Telephone Encounter (Signed)
Called and spoke with pt. Pt has been scheduled of an appt on 02/14/2019 at 3:15 PM for breast recheck. Pt aware that she has been scheduled.   Sent to [place] to schedule 3:15 PM appt on 02/14/2019

## 2019-01-31 NOTE — Telephone Encounter (Signed)
Scheduled

## 2019-02-02 ENCOUNTER — Ambulatory Visit
Admission: RE | Admit: 2019-02-02 | Discharge: 2019-02-02 | Disposition: A | Discharge status: Home / Self Care | Payer: Medicare Other | Source: Ambulatory Visit | Attending: Family Medicine | Admitting: Family Medicine

## 2019-02-02 DIAGNOSIS — Z853 Personal history of malignant neoplasm of breast: Secondary | ICD-10-CM

## 2019-02-02 DIAGNOSIS — N649 Disorder of breast, unspecified: Secondary | ICD-10-CM

## 2019-02-02 DIAGNOSIS — N6311 Unspecified lump in the right breast, upper outer quadrant: Secondary | ICD-10-CM | POA: Diagnosis not present

## 2019-02-02 DIAGNOSIS — R922 Inconclusive mammogram: Secondary | ICD-10-CM | POA: Diagnosis not present

## 2019-02-05 ENCOUNTER — Other Ambulatory Visit: Payer: Self-pay | Admitting: Family Medicine

## 2019-02-05 DIAGNOSIS — Z1231 Encounter for screening mammogram for malignant neoplasm of breast: Secondary | ICD-10-CM

## 2019-02-05 DIAGNOSIS — N6310 Unspecified lump in the right breast, unspecified quadrant: Secondary | ICD-10-CM

## 2019-02-12 ENCOUNTER — Other Ambulatory Visit: Payer: Self-pay | Admitting: Family Medicine

## 2019-02-12 DIAGNOSIS — Z1211 Encounter for screening for malignant neoplasm of colon: Secondary | ICD-10-CM | POA: Diagnosis not present

## 2019-02-12 DIAGNOSIS — R1310 Dysphagia, unspecified: Secondary | ICD-10-CM | POA: Diagnosis not present

## 2019-02-14 ENCOUNTER — Encounter: Payer: Self-pay | Admitting: Family Medicine

## 2019-02-14 ENCOUNTER — Ambulatory Visit: Payer: Medicare Other | Admitting: Family Medicine

## 2019-02-14 VITALS — BP 118/65 | HR 98 | Temp 98.2°F | Wt 164.6 lb

## 2019-02-14 DIAGNOSIS — Z853 Personal history of malignant neoplasm of breast: Secondary | ICD-10-CM | POA: Diagnosis not present

## 2019-02-14 NOTE — Progress Notes (Signed)
  Tommi Rumps, MD Phone: [phone]  Stephanie Mora is a 78 y.o. female who presents today for follow up.  CC: Breast lesion  Patient underwent mammogram that was negative.  Ultrasound was negative.  She notes no significant changes to her breasts.  Presents for repeat exam.  Social History   Tobacco Use  Smoking Status Former Smoker  Smokeless Tobacco Never Used     ROS see history of present illness  Objective  Physical Exam Vitals:   02/14/19 1531  BP: 118/65  Pulse: 98  Temp: 98.2 F (36.8 C)  SpO2: 100%    BP Readings from Last 3 Encounters:  02/14/19 118/65  01/29/19 118/78  07/28/18 118/76   Wt Readings from Last 3 Encounters:  02/14/19 164 lb 9.6 oz (74.7 kg)  01/29/19 163 lb 12.8 oz (74.3 kg)  07/28/18 166 lb (75.3 kg)    Physical Exam Exam conducted with a chaperone present.  Constitutional:      Appearance: Normal appearance.  Pulmonary:     Effort: Pulmonary effort is normal.  Chest:    Neurological:     Mental Status: She is alert.      Assessment/Plan: Please see individual problem list.  Personal history of breast cancer Lesion is still present.  May be related to scar tissue though we will refer to general surgery for exam and consideration of whether or not she needs a biopsy.  Discussed with referral coordinator to try to get the patient set up sometime in the next 1 to 2 weeks.   Orders Placed This Encounter  Procedures  . Ambulatory referral to General Surgery    Referral Priority:   Routine    Referral Type:   Surgical    Referral Reason:   Specialty Services Required    Requested Specialty:   General Surgery    Number of Visits Requested:   1    No orders of the defined types were placed in this encounter.    Tommi Rumps, MD [place]

## 2019-02-14 NOTE — Assessment & Plan Note (Addendum)
Lesion is still present.  May be related to scar tissue though we will refer to general surgery for exam and consideration of whether or not she needs a biopsy.  Discussed with referral coordinator to try to get the patient set up sometime in the next 1 to 2 weeks.

## 2019-02-14 NOTE — Patient Instructions (Signed)
Nice to see you. We will get you to see Dr. Bary Castilla to evaluate the area in your breast.  If you do not hear anything in the next week please let us know.

## 2019-02-20 DIAGNOSIS — Z1211 Encounter for screening for malignant neoplasm of colon: Secondary | ICD-10-CM | POA: Diagnosis not present

## 2019-03-01 ENCOUNTER — Encounter: Payer: Self-pay | Admitting: General Surgery

## 2019-03-01 ENCOUNTER — Other Ambulatory Visit: Payer: Self-pay

## 2019-03-01 ENCOUNTER — Ambulatory Visit: Payer: Medicare Other | Admitting: General Surgery

## 2019-03-01 VITALS — BP 156/87 | HR 80 | Temp 97.7°F | Resp 16 | Ht 62.0 in | Wt 163.0 lb

## 2019-03-01 DIAGNOSIS — Z853 Personal history of malignant neoplasm of breast: Secondary | ICD-10-CM | POA: Diagnosis not present

## 2019-03-01 DIAGNOSIS — H35371 Puckering of macula, right eye: Secondary | ICD-10-CM | POA: Diagnosis not present

## 2019-03-01 NOTE — Progress Notes (Signed)
Patient ID: Stephanie Mora, female   DOB: 1941/12/10, 78 y.o.   MRN: [id number]  Chief Complaint  Patient presents with  . Breast Problem    ref Dr.Sonnenberg palpable around the lumpectomy site/negative mammogram     HPI Stephanie Mora is a 78 y.o. female.  who presents for a breast evaluation referred by Dr Josephina Gip. The most recent mammogram was done on 02-02-19 and right breast ultrasound. Annual mammogram is due in April. Patient does not perform regular self breast checks. She does get regular mammograms done.  She states Dr Josephina Gip thought he felt something during her most recent exam and it is near the scarring. She can't feel anything different. She is retired from [employer]   HPI  Past Medical History:  Diagnosis Date  . Bilateral dry eyes   . Breast cancer ([place]) 2000   breast right, lumpectomy and XRT at [facility].ER/PR positive INVASIVE WELL DIFFERENTIATED DUCTAL ADENOCARCINOMA, TUBULAR TYPE.  Marland Kitchen Heart murmur    as child  . Hyperlipidemia   . Osteoporosis    reclast treatment in past  . Personal history of radiation therapy 2000   F/U right breast cancer    Past Surgical History:  Procedure Laterality Date  . BREAST BIOPSY Right 2000   +  . BREAST BIOPSY Left 1980's   neg, Dr Bary Castilla  . BREAST LUMPECTOMY Right 2000   at [facility]    Family History  Problem Relation Age of Onset  . Cancer Mother   . Breast cancer Mother 66  . Heart disease Father   . Heart attack Father 68  . Cancer Maternal Aunt   . Breast cancer Maternal Aunt        ? age    Social History Social History   Tobacco Use  . Smoking status: Former Smoker    Years: 25.00    Types: Cigarettes    Last attempt to quit: 12/20/1993    Years since quitting: 25.2  . Smokeless tobacco: Never Used  Substance Use Topics  . Alcohol use: Yes    Comment: occasional  . Drug use: No    No Known Allergies  Current Outpatient Medications  Medication Sig Dispense Refill  .  Acetylcysteine (N-ACETYL-L-CYSTEINE PO) Take 600 mcg by mouth 2 (two) times daily.    Marland Kitchen aspirin 81 MG tablet Take 81 mg by mouth daily.    . Cholecalciferol (VITAMIN D3) 2000 UNITS TABS Take 1 tablet by mouth daily.    . Coenzyme Q10 (COQ10) 100 MG CAPS Take 1 capsule by mouth daily.    . Emollient (COLLAGEN EX) Apply topically.    . fluticasone (FLONASE) 50 MCG/ACT nasal spray Place 2 sprays into the nose as needed.    Marland Kitchen MISC NATURAL PRODUCT OP Take by mouth. Tumeric 500mg      . Multiple Vitamin (MULTIVITAMIN) tablet Take 1 tablet by mouth daily.    Vladimir Faster Glycol-Propyl Glycol (SYSTANE) 0.4-0.3 % SOLN Apply to eye.    . simvastatin (ZOCOR) 20 MG tablet TAKE 1 TABLET BY MOUTH EVERY EVENING 90 tablet 0  . venlafaxine XR (EFFEXOR-XR) 150 MG 24 hr capsule TAKE ONE CAPSULE BY MOUTH ONCE DAILY 90 capsule 0  . vitamin C (ASCORBIC ACID) 500 MG tablet Take 500 mg by mouth 2 (two) times daily.     No current facility-administered medications for this visit.     Review of Systems Review of Systems  Constitutional: Negative.   Respiratory: Negative.   Cardiovascular: Negative.  Blood pressure (!) 156/87, pulse 80, temperature 97.7 F (36.5 C), temperature source Temporal, resp. rate 16, height 5\' 2"  (1.575 m), weight 163 lb (73.9 kg), SpO2 98 %.  Physical Exam Physical Exam Exam conducted with a chaperone present.  Constitutional:      Appearance: She is well-developed.  Eyes:     General: No scleral icterus.    Conjunctiva/sclera: Conjunctivae normal.  Neck:     Musculoskeletal: Neck supple.  Cardiovascular:     Rate and Rhythm: Normal rate and regular rhythm.     Heart sounds: Normal heart sounds.  Pulmonary:     Effort: Pulmonary effort is normal.     Breath sounds: Normal breath sounds.  Chest:     Breasts:        Right: Mass present. No inverted nipple, nipple discharge, skin change or tenderness.        Left: No inverted nipple, mass, nipple discharge, skin change or  tenderness.       Comments: Right breast lumpectomy scar has a 3 mm nodule. Lymphadenopathy:     Cervical: No cervical adenopathy.     Upper Body:     Right upper body: No supraclavicular or axillary adenopathy.     Left upper body: No supraclavicular or axillary adenopathy.  Skin:    General: Skin is warm and dry.  Neurological:     Mental Status: She is alert and oriented to person, place, and time.  Psychiatric:        Behavior: Behavior normal.     Data Reviewed The April 09, 1999 right breast biopsy completed at [facility] showed invasive ductal carcinoma, grade 2.  ER/PR positive.  Operative resection May 08, 1999 showed scant residual tissue.  Sentinel node negative x2.  Patient reports receiving whole breast radiation.  She does not recall antiestrogen therapy.  Right breast diagnostic mammogram and ultrasound dated February 02, 2019 were reviewed.  Bandlike calcifications in the area of the surgical scar, corresponds to palpable finding.  Ultrasound showed no abnormality.  BI-RADS-3.     Assessment Benign breast exam.  Plan Recommendation from the radiology service was for bilateral diagnostic mammograms in April 2020.  I cannot imagine that a short-term follow-up of 2 months would be of benefit in regards to the right breast.    Considering the scar calcifications correlate to the area of palpable thickening, I think the patient would do well with a left screening mammogram in April and then resume bilateral screening mammograms in April 2021.   Follow up as needed. The patient is aware to call back for any questions or new concerns.   HPI, assessment, plan and physical exam has been scribed under the direction and in the presence of Robert Bellow, MD. Karie Fetch, RN  I have completed the exam and reviewed the above documentation for accuracy and completeness.  I agree with the above.  Haematologist has been used and any errors in dictation or  transcription are unintentional.  Hervey Ard, M.D., F.A.C.S.  Forest Gleason Byrnett 03/01/2019, 2:11 PM

## 2019-03-01 NOTE — Patient Instructions (Signed)
  Left screening mammogram as scheduled in April. Follow up as needed. The patient is aware to call back for any questions or new concerns.

## 2019-03-21 ENCOUNTER — Other Ambulatory Visit: Payer: Self-pay | Admitting: Family Medicine

## 2019-04-11 ENCOUNTER — Encounter: Admission: RE | Payer: Self-pay | Source: Home / Self Care

## 2019-04-11 ENCOUNTER — Ambulatory Visit: Admission: RE | Admit: 2019-04-11 | Payer: Medicare Other | Source: Home / Self Care | Admitting: Ophthalmology

## 2019-04-11 SURGERY — PHACOEMULSIFICATION, CATARACT, WITH IOL INSERTION
Anesthesia: Topical | Laterality: Right

## 2019-04-16 ENCOUNTER — Ambulatory Visit
Admission: RE | Admit: 2019-04-16 | Payer: Medicare Other | Source: Home / Self Care | Admitting: Unknown Physician Specialty

## 2019-04-16 ENCOUNTER — Encounter: Admission: RE | Payer: Self-pay | Source: Home / Self Care

## 2019-04-16 SURGERY — ESOPHAGOGASTRODUODENOSCOPY (EGD) WITH PROPOFOL
Anesthesia: General

## 2019-05-16 DIAGNOSIS — E7800 Pure hypercholesterolemia, unspecified: Secondary | ICD-10-CM | POA: Diagnosis not present

## 2019-05-16 DIAGNOSIS — H2511 Age-related nuclear cataract, right eye: Secondary | ICD-10-CM | POA: Diagnosis not present

## 2019-05-17 ENCOUNTER — Other Ambulatory Visit: Payer: Self-pay

## 2019-05-17 NOTE — Discharge Instructions (Signed)

## 2019-05-18 ENCOUNTER — Other Ambulatory Visit: Payer: Self-pay | Admitting: Family Medicine

## 2019-05-18 ENCOUNTER — Other Ambulatory Visit
Admission: RE | Admit: 2019-05-18 | Discharge: 2019-05-18 | Disposition: A | Discharge status: Home / Self Care | Payer: Medicare Other | Source: Ambulatory Visit | Attending: Ophthalmology | Admitting: Ophthalmology

## 2019-05-18 DIAGNOSIS — Z1159 Encounter for screening for other viral diseases: Secondary | ICD-10-CM | POA: Diagnosis not present

## 2019-05-19 ENCOUNTER — Encounter: Payer: Self-pay | Admitting: Family Medicine

## 2019-05-19 LAB — NOVEL CORONAVIRUS, NAA (HOSP ORDER, SEND-OUT TO REF LAB; TAT 18-24 HRS): SARS-CoV-2, NAA: NOT DETECTED

## 2019-05-23 ENCOUNTER — Ambulatory Visit: Payer: Medicare Other | Admitting: Anesthesiology

## 2019-05-23 ENCOUNTER — Encounter
Admission: RE | Disposition: A | Discharge status: Home / Self Care | Payer: Self-pay | Source: Home / Self Care | Attending: Ophthalmology

## 2019-05-23 ENCOUNTER — Ambulatory Visit
Admission: RE | Admit: 2019-05-23 | Discharge: 2019-05-23 | Disposition: A | Discharge status: Home / Self Care | Payer: Medicare Other | Attending: Ophthalmology | Admitting: Ophthalmology

## 2019-05-23 DIAGNOSIS — Z79899 Other long term (current) drug therapy: Secondary | ICD-10-CM | POA: Diagnosis not present

## 2019-05-23 DIAGNOSIS — Z7982 Long term (current) use of aspirin: Secondary | ICD-10-CM | POA: Diagnosis not present

## 2019-05-23 DIAGNOSIS — H2511 Age-related nuclear cataract, right eye: Secondary | ICD-10-CM | POA: Insufficient documentation

## 2019-05-23 DIAGNOSIS — Z87891 Personal history of nicotine dependence: Secondary | ICD-10-CM | POA: Diagnosis not present

## 2019-05-23 DIAGNOSIS — E119 Type 2 diabetes mellitus without complications: Secondary | ICD-10-CM | POA: Diagnosis not present

## 2019-05-23 DIAGNOSIS — E7800 Pure hypercholesterolemia, unspecified: Secondary | ICD-10-CM | POA: Diagnosis not present

## 2019-05-23 DIAGNOSIS — M810 Age-related osteoporosis without current pathological fracture: Secondary | ICD-10-CM | POA: Insufficient documentation

## 2019-05-23 DIAGNOSIS — H25811 Combined forms of age-related cataract, right eye: Secondary | ICD-10-CM | POA: Diagnosis not present

## 2019-05-23 HISTORY — PX: CATARACT EXTRACTION W/PHACO: SHX586

## 2019-05-23 SURGERY — PHACOEMULSIFICATION, CATARACT, WITH IOL INSERTION
Anesthesia: Monitor Anesthesia Care | Site: Eye | Laterality: Right

## 2019-05-23 MED ORDER — OXYCODONE HCL 5 MG/5ML PO SOLN: 5.0000 mg | Freq: Once | ORAL | Status: DC | PRN

## 2019-05-23 MED ORDER — PROMETHAZINE HCL 25 MG/ML IJ SOLN: 6.2500 mg | INTRAMUSCULAR | Status: DC | PRN

## 2019-05-23 MED ORDER — ARMC OPHTHALMIC DILATING DROPS
1.0000 "application " | OPHTHALMIC | Status: DC | PRN
  Administered 2019-05-23 (×3): 1 via OPHTHALMIC

## 2019-05-23 MED ORDER — FENTANYL CITRATE (PF) 100 MCG/2ML IJ SOLN
INTRAMUSCULAR | Status: DC | PRN
  Administered 2019-05-23: 50 ug via INTRAVENOUS

## 2019-05-23 MED ORDER — MIDAZOLAM HCL 2 MG/2ML IJ SOLN
INTRAMUSCULAR | Status: DC | PRN
  Administered 2019-05-23 (×2): 1 mg via INTRAVENOUS

## 2019-05-23 MED ORDER — FENTANYL CITRATE (PF) 100 MCG/2ML IJ SOLN: 25.0000 ug | INTRAMUSCULAR | Status: DC | PRN

## 2019-05-23 MED ORDER — MEPERIDINE HCL 25 MG/ML IJ SOLN: 6.2500 mg | INTRAMUSCULAR | Status: DC | PRN

## 2019-05-23 MED ORDER — NA HYALUR & NA CHOND-NA HYALUR 0.4-0.35 ML IO KIT
PACK | INTRAOCULAR | Status: DC | PRN
  Administered 2019-05-23: 1 mL via INTRAOCULAR

## 2019-05-23 MED ORDER — MOXIFLOXACIN HCL 0.5 % OP SOLN
1.0000 [drp] | OPHTHALMIC | Status: DC | PRN
  Administered 2019-05-23 (×3): 1 [drp] via OPHTHALMIC

## 2019-05-23 MED ORDER — LACTATED RINGERS IV SOLN: 10.0000 mL/h | INTRAVENOUS | Status: DC

## 2019-05-23 MED ORDER — EPINEPHRINE PF 1 MG/ML IJ SOLN
INTRAOCULAR | Status: DC | PRN
  Administered 2019-05-23: 57 mL via OPHTHALMIC

## 2019-05-23 MED ORDER — BRIMONIDINE TARTRATE-TIMOLOL 0.2-0.5 % OP SOLN
OPHTHALMIC | Status: DC | PRN
  Administered 2019-05-23: 1 [drp] via OPHTHALMIC

## 2019-05-23 MED ORDER — OXYCODONE HCL 5 MG PO TABS: 5.0000 mg | ORAL_TABLET | Freq: Once | ORAL | Status: DC | PRN

## 2019-05-23 MED ORDER — LIDOCAINE HCL (PF) 2 % IJ SOLN
INTRAOCULAR | Status: DC | PRN
  Administered 2019-05-23: 1 mL

## 2019-05-23 MED ORDER — CEFUROXIME OPHTHALMIC INJECTION 1 MG/0.1 ML
INJECTION | OPHTHALMIC | Status: DC | PRN
  Administered 2019-05-23: 0.1 mL via OPHTHALMIC

## 2019-05-23 MED ORDER — TETRACAINE HCL 0.5 % OP SOLN
1.0000 [drp] | OPHTHALMIC | Status: DC | PRN
  Administered 2019-05-23 (×3): 1 [drp] via OPHTHALMIC

## 2019-05-23 SURGICAL SUPPLY — 26 items
ALCON ACRYSOF IQ TORIC LENS (Intraocular Lens) ×1 IMPLANT
CANNULA ANT/CHMB 27G (MISCELLANEOUS) ×1 IMPLANT
CANNULA ANT/CHMB 27GA (MISCELLANEOUS) ×2 IMPLANT
GLOVE SURG LX 7.5 STRW (GLOVE) ×1
GLOVE SURG LX STRL 7.5 STRW (GLOVE) ×1 IMPLANT
GLOVE SURG TRIUMPH 8.0 PF LTX (GLOVE) ×3 IMPLANT
GOWN STRL REUS W/ TWL LRG LVL3 (GOWN DISPOSABLE) ×2 IMPLANT
GOWN STRL REUS W/TWL LRG LVL3 (GOWN DISPOSABLE) ×2
MARKER SKIN DUAL TIP RULER LAB (MISCELLANEOUS) ×2 IMPLANT
NDL FILTER BLUNT 18X1 1/2 (NEEDLE) ×1 IMPLANT
NDL RETROBULBAR .5 NSTRL (NEEDLE) IMPLANT
NEEDLE FILTER BLUNT 18X 1/2SAF (NEEDLE) ×1
NEEDLE FILTER BLUNT 18X1 1/2 (NEEDLE) ×1 IMPLANT
PACK CATARACT BRASINGTON (MISCELLANEOUS) ×2 IMPLANT
PACK EYE AFTER SURG (MISCELLANEOUS) ×2 IMPLANT
PACK OPTHALMIC (MISCELLANEOUS) ×2 IMPLANT
RING MALYGIN 7.0 (MISCELLANEOUS) IMPLANT
SUT ETHILON 10-0 CS-B-6CS-B-6 (SUTURE)
SUT VICRYL  9 0 (SUTURE)
SUT VICRYL 9 0 (SUTURE) IMPLANT
SUTURE EHLN 10-0 CS-B-6CS-B-6 (SUTURE) IMPLANT
SYR 3ML LL SCALE MARK (SYRINGE) ×2 IMPLANT
SYR 5ML LL (SYRINGE) ×2 IMPLANT
SYR TB 1ML LUER SLIP (SYRINGE) ×2 IMPLANT
WATER STERILE IRR 500ML POUR (IV SOLUTION) ×2 IMPLANT
WIPE NON LINTING 3.25X3.25 (MISCELLANEOUS) ×2 IMPLANT

## 2019-05-23 NOTE — Transfer of Care (Signed)
Immediate Anesthesia Transfer of Care Note  Patient: Stephanie Mora  Procedure(s) Performed: CATARACT EXTRACTION PHACO AND INTRAOCULAR LENS PLACEMENT (IOC)  RIGHT TORIC LENS (Right Eye)  Patient Location: PACU  Anesthesia Type: MAC  Level of Consciousness: awake, alert  and patient cooperative  Airway and Oxygen Therapy: Patient Spontanous Breathing and Patient connected to supplemental oxygen  Post-op Assessment: Post-op Vital signs reviewed, Patient's Cardiovascular Status Stable, Respiratory Function Stable, Patent Airway and No signs of Nausea or vomiting  Post-op Vital Signs: Reviewed and stable  Complications: No apparent anesthesia complications

## 2019-05-23 NOTE — Anesthesia Postprocedure Evaluation (Signed)
Anesthesia Post Note  Patient: ROMONA MURDY  Procedure(s) Performed: CATARACT EXTRACTION PHACO AND INTRAOCULAR LENS PLACEMENT (IOC)  RIGHT TORIC LENS (Right Eye)  Patient location during evaluation: PACU Anesthesia Type: MAC Level of consciousness: awake and alert Pain management: pain level controlled Vital Signs Assessment: post-procedure vital signs reviewed and stable Respiratory status: spontaneous breathing, nonlabored ventilation, respiratory function stable and patient connected to nasal cannula oxygen Cardiovascular status: stable and blood pressure returned to baseline Postop Assessment: no apparent nausea or vomiting Anesthetic complications: no    Kambree Krauss ELAINE

## 2019-05-23 NOTE — Anesthesia Procedure Notes (Signed)
Procedure Name: MAC Date/Time: 05/23/2019 12:29 PM Performed by: Cameron Ali, CRNA Pre-anesthesia Checklist: Patient identified, Emergency Drugs available, Suction available, Timeout performed and Patient being monitored Patient Re-evaluated:Patient Re-evaluated prior to induction Oxygen Delivery Method: Nasal cannula Placement Confirmation: positive ETCO2

## 2019-05-23 NOTE — Anesthesia Preprocedure Evaluation (Addendum)
Anesthesia Evaluation  Patient identified by MRN, date of birth, ID band Patient awake    Reviewed: Allergy & Precautions, H&P , NPO status , Patient's Chart, lab work & pertinent test results, reviewed documented beta blocker date and time   Airway Mallampati: II  TM Distance: >3 FB Neck ROM: full    Dental no notable dental hx.    Pulmonary neg pulmonary ROS, former smoker,    Pulmonary exam normal breath sounds clear to auscultation       Cardiovascular Exercise Tolerance: Good negative cardio ROS   Rhythm:regular Rate:Normal     Neuro/Psych negative neurological ROS  negative psych ROS   GI/Hepatic negative GI ROS, Neg liver ROS, GERD  ,  Endo/Other  negative endocrine ROS  Renal/GU negative Renal ROS  negative genitourinary   Musculoskeletal   Abdominal   Peds  Hematology negative hematology ROS (+)   Anesthesia Other Findings   Reproductive/Obstetrics negative OB ROS                             Anesthesia Physical Anesthesia Plan  ASA: II  Anesthesia Plan: MAC   Post-op Pain Management:    Induction:   PONV Risk Score and Plan:   Airway Management Planned:   Additional Equipment:   Intra-op Plan:   Post-operative Plan:   Informed Consent: I have reviewed the patients History and Physical, chart, labs and discussed the procedure including the risks, benefits and alternatives for the proposed anesthesia with the patient or authorized representative who has indicated his/her understanding and acceptance.     Dental Advisory Given  Plan Discussed with: CRNA  Anesthesia Plan Comments:        Anesthesia Quick Evaluation

## 2019-05-23 NOTE — H&P (Signed)

## 2019-05-23 NOTE — Op Note (Signed)
LOCATION:  [place]   PREOPERATIVE DIAGNOSIS:  Nuclear sclerotic cataract of the right eye.  H25.11   POSTOPERATIVE DIAGNOSIS:  Nuclear sclerotic cataract of the right eye.   PROCEDURE:  Phacoemulsification with Toric posterior chamber intraocular lens placement of the right eye.   LENS:   Implant Name Type Inv. Item Serial No. Manufacturer Lot No. LRB No. Used  ALCON ACRYSOF IQ TORIC LENS Intraocular Lens  [id number] ALCON  Right 1     [id number] 18.5 D Toric intraocular lens with 3.0 diopters of cylindrical power with axis orientation at 179 degrees.   ULTRASOUND TIME: 14 % of 1 minutes, 4 seconds.  CDE 8.7   SURGEON:  Wyonia Hough, MD   ANESTHESIA: Topical with tetracaine drops and 2% Xylocaine jelly, augmented with 1% preservative-free intracameral lidocaine. .   COMPLICATIONS:  None.   DESCRIPTION OF PROCEDURE:  The patient was identified in the holding room and transported to the operating suite and placed in the supine position under the operating microscope.  The right eye was identified as the operative eye, and it was prepped and draped in the usual sterile ophthalmic fashion.    A clear-corneal paracentesis incision was made at the 12:00 position.  0.5 ml of preservative-free 1% lidocaine was injected into the anterior chamber. The anterior chamber was filled with Viscoat.  A 2.4 millimeter near clear corneal incision was then made at the 9:00 position.  A cystotome and capsulorrhexis forceps were then used to make a curvilinear capsulorrhexis.  Hydrodissection and hydrodelineation were then performed using balanced salt solution.   Phacoemulsification was then used in stop and chop fashion to remove the lens, nucleus and epinucleus.  The remaining cortex was aspirated using the irrigation and aspiration handpiece.  Provisc viscoelastic was then placed into the capsular bag to distend it for lens placement.  The Verion digital marker was used to align the  implant at the intended axis.   A Toric lens was then injected into the capsular bag.  It was rotated clockwise until the axis marks on the lens were approximately 15 degrees in the counterclockwise direction to the intended alignment.  The viscoelastic was aspirated from the eye using the irrigation aspiration handpiece.  Then, a Koch spatula through the sideport incision was used to rotate the lens in a clockwise direction until the axis markings of the intraocular lens were lined up with the Verion alignment.  Balanced salt solution was then used to hydrate the wounds. Cefuroxime 0.1 ml of a 10mg /ml solution was injected into the anterior chamber for a dose of 1 mg of intracameral antibiotic at the completion of the case.    The eye was noted to have a physiologic pressure and there was no wound leak noted.   Timolol and Brimonidine drops were applied to the eye.  The patient was taken to the recovery room in stable condition having had no complications of anesthesia or surgery.  Medha Pippen 05/23/2019, 12:54 PM

## 2019-05-23 NOTE — H&P (Signed)

## 2019-05-24 ENCOUNTER — Encounter: Payer: Self-pay | Admitting: Ophthalmology

## 2019-05-27 DIAGNOSIS — H2512 Age-related nuclear cataract, left eye: Secondary | ICD-10-CM | POA: Diagnosis not present

## 2019-05-29 DIAGNOSIS — E7800 Pure hypercholesterolemia, unspecified: Secondary | ICD-10-CM | POA: Diagnosis not present

## 2019-06-06 ENCOUNTER — Other Ambulatory Visit: Payer: Self-pay

## 2019-06-06 ENCOUNTER — Encounter: Payer: Self-pay | Admitting: *Deleted

## 2019-06-07 NOTE — Discharge Instructions (Signed)

## 2019-06-08 ENCOUNTER — Other Ambulatory Visit
Admission: RE | Admit: 2019-06-08 | Discharge: 2019-06-08 | Disposition: A | Discharge status: Home / Self Care | Payer: Medicare Other | Source: Ambulatory Visit | Attending: Ophthalmology | Admitting: Ophthalmology

## 2019-06-08 ENCOUNTER — Other Ambulatory Visit: Payer: Self-pay

## 2019-06-08 DIAGNOSIS — Z1159 Encounter for screening for other viral diseases: Secondary | ICD-10-CM | POA: Insufficient documentation

## 2019-06-09 LAB — NOVEL CORONAVIRUS, NAA (HOSP ORDER, SEND-OUT TO REF LAB; TAT 18-24 HRS): SARS-CoV-2, NAA: NOT DETECTED

## 2019-06-13 ENCOUNTER — Encounter
Admission: RE | Disposition: A | Discharge status: Home / Self Care | Payer: Self-pay | Source: Home / Self Care | Attending: Ophthalmology

## 2019-06-13 ENCOUNTER — Ambulatory Visit
Admission: RE | Admit: 2019-06-13 | Discharge: 2019-06-13 | Disposition: A | Discharge status: Home / Self Care | Payer: Medicare Other | Attending: Ophthalmology | Admitting: Ophthalmology

## 2019-06-13 ENCOUNTER — Ambulatory Visit: Payer: Medicare Other | Admitting: Anesthesiology

## 2019-06-13 DIAGNOSIS — H2512 Age-related nuclear cataract, left eye: Secondary | ICD-10-CM | POA: Insufficient documentation

## 2019-06-13 DIAGNOSIS — E1136 Type 2 diabetes mellitus with diabetic cataract: Secondary | ICD-10-CM | POA: Diagnosis not present

## 2019-06-13 DIAGNOSIS — Z853 Personal history of malignant neoplasm of breast: Secondary | ICD-10-CM | POA: Diagnosis not present

## 2019-06-13 DIAGNOSIS — Z87891 Personal history of nicotine dependence: Secondary | ICD-10-CM | POA: Insufficient documentation

## 2019-06-13 DIAGNOSIS — H25812 Combined forms of age-related cataract, left eye: Secondary | ICD-10-CM | POA: Diagnosis not present

## 2019-06-13 HISTORY — PX: CATARACT EXTRACTION W/PHACO: SHX586

## 2019-06-13 SURGERY — PHACOEMULSIFICATION, CATARACT, WITH IOL INSERTION
Anesthesia: Monitor Anesthesia Care | Site: Eye | Laterality: Left

## 2019-06-13 MED ORDER — NA HYALUR & NA CHOND-NA HYALUR 0.4-0.35 ML IO KIT
PACK | INTRAOCULAR | Status: DC | PRN
  Administered 2019-06-13: 1 mL via INTRAOCULAR

## 2019-06-13 MED ORDER — TETRACAINE HCL 0.5 % OP SOLN
1.0000 [drp] | OPHTHALMIC | Status: DC | PRN
  Administered 2019-06-13 (×3): 1 [drp] via OPHTHALMIC

## 2019-06-13 MED ORDER — MOXIFLOXACIN HCL 0.5 % OP SOLN
1.0000 [drp] | OPHTHALMIC | Status: DC | PRN
  Administered 2019-06-13 (×3): 1 [drp] via OPHTHALMIC

## 2019-06-13 MED ORDER — LACTATED RINGERS IV SOLN: INTRAVENOUS | Status: DC

## 2019-06-13 MED ORDER — BRIMONIDINE TARTRATE-TIMOLOL 0.2-0.5 % OP SOLN
OPHTHALMIC | Status: DC | PRN
  Administered 2019-06-13: 1 [drp] via OPHTHALMIC

## 2019-06-13 MED ORDER — ARMC OPHTHALMIC DILATING DROPS
1.0000 "application " | OPHTHALMIC | Status: DC | PRN
  Administered 2019-06-13 (×3): 1 via OPHTHALMIC

## 2019-06-13 MED ORDER — CEFUROXIME OPHTHALMIC INJECTION 1 MG/0.1 ML
INJECTION | OPHTHALMIC | Status: DC | PRN
  Administered 2019-06-13: 0.1 mL via INTRACAMERAL

## 2019-06-13 MED ORDER — FENTANYL CITRATE (PF) 100 MCG/2ML IJ SOLN
INTRAMUSCULAR | Status: DC | PRN
  Administered 2019-06-13: 50 ug via INTRAVENOUS

## 2019-06-13 MED ORDER — MIDAZOLAM HCL 2 MG/2ML IJ SOLN
INTRAMUSCULAR | Status: DC | PRN
  Administered 2019-06-13: 2 mg via INTRAVENOUS

## 2019-06-13 MED ORDER — LIDOCAINE HCL (PF) 2 % IJ SOLN
INTRAOCULAR | Status: DC | PRN
  Administered 2019-06-13: 1 mL

## 2019-06-13 MED ORDER — EPINEPHRINE PF 1 MG/ML IJ SOLN
INTRAOCULAR | Status: DC | PRN
  Administered 2019-06-13: 13:00:00 64 mL via OPHTHALMIC

## 2019-06-13 SURGICAL SUPPLY — 24 items
ACRYSOF IQ TORIC IOL (Intraocular Lens) ×2 IMPLANT
CANNULA ANT/CHMB 27GA (MISCELLANEOUS) ×2 IMPLANT
GLOVE SURG LX 7.5 STRW (GLOVE) ×2
GLOVE SURG LX STRL 7.5 STRW (GLOVE) ×2 IMPLANT
GLOVE SURG TRIUMPH 8.0 PF LTX (GLOVE) ×2 IMPLANT
GOWN STRL REUS W/ TWL LRG LVL3 (GOWN DISPOSABLE) ×2 IMPLANT
GOWN STRL REUS W/TWL LRG LVL3 (GOWN DISPOSABLE) ×2
MARKER SKIN DUAL TIP RULER LAB (MISCELLANEOUS) ×2 IMPLANT
NDL RETROBULBAR .5 NSTRL (NEEDLE) IMPLANT
NEEDLE FILTER BLUNT 18X 1/2SAF (NEEDLE) ×1
NEEDLE FILTER BLUNT 18X1 1/2 (NEEDLE) ×1 IMPLANT
PACK CATARACT BRASINGTON (MISCELLANEOUS) ×2 IMPLANT
PACK EYE AFTER SURG (MISCELLANEOUS) ×2 IMPLANT
PACK OPTHALMIC (MISCELLANEOUS) ×2 IMPLANT
RING MALYGIN 7.0 (MISCELLANEOUS) IMPLANT
SUT ETHILON 10-0 CS-B-6CS-B-6 (SUTURE)
SUT VICRYL  9 0 (SUTURE)
SUT VICRYL 9 0 (SUTURE) IMPLANT
SUTURE EHLN 10-0 CS-B-6CS-B-6 (SUTURE) IMPLANT
SYR 3ML LL SCALE MARK (SYRINGE) ×2 IMPLANT
SYR 5ML LL (SYRINGE) ×2 IMPLANT
SYR TB 1ML LUER SLIP (SYRINGE) ×2 IMPLANT
WATER STERILE IRR 500ML POUR (IV SOLUTION) ×2 IMPLANT
WIPE NON LINTING 3.25X3.25 (MISCELLANEOUS) ×2 IMPLANT

## 2019-06-13 NOTE — Transfer of Care (Signed)
Immediate Anesthesia Transfer of Care Note  Patient: Stephanie Mora  Procedure(s) Performed: CATARACT EXTRACTION PHACO AND INTRAOCULAR LENS PLACEMENT (IOC) LEFT TORIC LENS (Left Eye)  Patient Location: PACU  Anesthesia Type: MAC  Level of Consciousness: awake, alert  and patient cooperative  Airway and Oxygen Therapy: Patient Spontanous Breathing and Patient connected to supplemental oxygen  Post-op Assessment: Post-op Vital signs reviewed, Patient's Cardiovascular Status Stable, Respiratory Function Stable, Patent Airway and No signs of Nausea or vomiting  Post-op Vital Signs: Reviewed and stable  Complications: No apparent anesthesia complications

## 2019-06-13 NOTE — Anesthesia Postprocedure Evaluation (Signed)
Anesthesia Post Note  Patient: Stephanie Mora  Procedure(s) Performed: CATARACT EXTRACTION PHACO AND INTRAOCULAR LENS PLACEMENT (IOC) LEFT TORIC LENS (Left Eye)  Patient location during evaluation: PACU Anesthesia Type: MAC Level of consciousness: awake and alert Pain management: pain level controlled Vital Signs Assessment: post-procedure vital signs reviewed and stable Respiratory status: spontaneous breathing, nonlabored ventilation, respiratory function stable and patient connected to nasal cannula oxygen Cardiovascular status: stable and blood pressure returned to baseline Postop Assessment: no apparent nausea or vomiting Anesthetic complications: no    Veda Canning

## 2019-06-13 NOTE — Anesthesia Procedure Notes (Signed)
Procedure Name: MAC Date/Time: 06/13/2019 1:06 PM Performed by: Janna Arch, CRNA Pre-anesthesia Checklist: Patient identified, Emergency Drugs available, Suction available, Timeout performed and Patient being monitored Patient Re-evaluated:Patient Re-evaluated prior to induction Oxygen Delivery Method: Nasal cannula Placement Confirmation: positive ETCO2

## 2019-06-13 NOTE — H&P (Signed)

## 2019-06-13 NOTE — Op Note (Signed)
LOCATION:  [place]   PREOPERATIVE DIAGNOSIS:  Nuclear sclerotic cataract of the left eye.  H25.12  POSTOPERATIVE DIAGNOSIS:  Nuclear sclerotic cataract of the left eye.   PROCEDURE:  Phacoemulsification with Toric posterior chamber intraocular lens placement of the left eye.   LENS:  Implant Name Type Inv. Item Serial No. Manufacturer Lot No. LRB No. Used Action  ACRYSOF IQ TORIC IOL Intraocular Lens  [id number] ALCON  Left 1 Implanted   [id number] 19.5D Toric intraocular lens with 2.25 diopters of cylindrical power with axis orientation at 7 degrees.   ULTRASOUND TIME: 11 % of 1 minutes, 1 seconds.  CDE 7.0   SURGEON:  Wyonia Hough, MD   ANESTHESIA:  Topical with tetracaine drops and 2% Xylocaine jelly, augmented with 1% preservative-free intracameral lidocaine.  COMPLICATIONS:  None.   DESCRIPTION OF PROCEDURE:  The patient was identified in the holding room and transported to the operating suite and placed in the supine position under the operating microscope.  The left eye was identified as the operative eye, and it was prepped and draped in the usual sterile ophthalmic fashion.    A clear-corneal paracentesis incision was made at the 1:30 position.  0.5 ml of preservative-free 1% lidocaine was injected into the anterior chamber. The anterior chamber was filled with Viscoat.  A 2.4 millimeter near clear corneal incision was then made at the 10:30 position.  A cystotome and capsulorrhexis forceps were then used to make a curvilinear capsulorrhexis.  Hydrodissection and hydrodelineation were then performed using balanced salt solution.   Phacoemulsification was then used in stop and chop fashion to remove the lens, nucleus and epinucleus.  The remaining cortex was aspirated using the irrigation and aspiration handpiece.  Provisc viscoelastic was then placed into the capsular bag to distend it for lens placement.  The Verion digital marker was used to align the implant  at the intended axis.   A 19.5 diopter lens was then injected into the capsular bag.  It was rotated clockwise until the axis marks on the lens were approximately 15 degrees in the counterclockwise direction to the intended alignment.  The viscoelastic was aspirated from the eye using the irrigation aspiration handpiece.  Then, a Koch spatula through the sideport incision was used to rotate the lens in a clockwise direction until the axis markings of the intraocular lens were lined up with the Verion alignment.  Balanced salt solution was then used to hydrate the wounds. Cefuroxime 0.1 ml of a 10mg /ml solution was injected into the anterior chamber for a dose of 1 mg of intracameral antibiotic at the completion of the case.    The eye was noted to have a physiologic pressure and there was no wound leak noted.   Timolol and Brimonidine drops were applied to the eye.  The patient was taken to the recovery room in stable condition having had no complications of anesthesia or surgery.  Stephanie Mora 06/13/2019, 1:32 PM

## 2019-06-13 NOTE — Anesthesia Preprocedure Evaluation (Signed)
Anesthesia Evaluation  Patient identified by MRN, date of birth, ID band Patient awake    Reviewed: Allergy & Precautions, H&P , NPO status , Patient's Chart, lab work & pertinent test results  Airway Mallampati: II  TM Distance: >3 FB Neck ROM: full    Dental no notable dental hx.    Pulmonary neg pulmonary ROS, former smoker,    breath sounds clear to auscultation       Cardiovascular Exercise Tolerance: Good negative cardio ROS   Rhythm:regular Rate:Normal     Neuro/Psych Depression negative neurological ROS     GI/Hepatic negative GI ROS, GERD  ,  Endo/Other  negative endocrine ROS  Renal/GU      Musculoskeletal   Abdominal   Peds  Hematology   Anesthesia Other Findings   Reproductive/Obstetrics                             Anesthesia Physical  Anesthesia Plan  ASA: II  Anesthesia Plan: MAC   Post-op Pain Management:    Induction:   PONV Risk Score and Plan:   Airway Management Planned:   Additional Equipment:   Intra-op Plan:   Post-operative Plan:   Informed Consent: I have reviewed the patients History and Physical, chart, labs and discussed the procedure including the risks, benefits and alternatives for the proposed anesthesia with the patient or authorized representative who has indicated his/her understanding and acceptance.       Plan Discussed with: CRNA  Anesthesia Plan Comments:         Anesthesia Quick Evaluation

## 2019-06-14 ENCOUNTER — Encounter: Payer: Self-pay | Admitting: Ophthalmology

## 2019-06-21 ENCOUNTER — Other Ambulatory Visit: Payer: Self-pay | Admitting: Family Medicine

## 2019-07-03 ENCOUNTER — Ambulatory Visit: Payer: Medicare Other

## 2019-07-03 ENCOUNTER — Telehealth: Payer: Self-pay

## 2019-07-03 ENCOUNTER — Other Ambulatory Visit: Payer: Self-pay

## 2019-07-03 NOTE — Telephone Encounter (Signed)
I called patient for annual wellness and health maintenance update appointment. No answer. No voicemail. Please reschedule with nurse health advisor by calling Ebony Hail at [phone].

## 2019-07-26 ENCOUNTER — Other Ambulatory Visit: Payer: Self-pay

## 2019-07-31 ENCOUNTER — Other Ambulatory Visit: Payer: Self-pay

## 2019-07-31 ENCOUNTER — Encounter: Payer: Self-pay | Admitting: Family Medicine

## 2019-07-31 ENCOUNTER — Ambulatory Visit (INDEPENDENT_AMBULATORY_CARE_PROVIDER_SITE_OTHER): Payer: Medicare Other | Admitting: Family Medicine

## 2019-07-31 VITALS — BP 125/80 | HR 73 | Temp 97.4°F | Ht 62.0 in | Wt 167.0 lb

## 2019-07-31 DIAGNOSIS — Z1239 Encounter for other screening for malignant neoplasm of breast: Secondary | ICD-10-CM | POA: Diagnosis not present

## 2019-07-31 DIAGNOSIS — R1310 Dysphagia, unspecified: Secondary | ICD-10-CM | POA: Diagnosis not present

## 2019-07-31 DIAGNOSIS — R42 Dizziness and giddiness: Secondary | ICD-10-CM

## 2019-07-31 DIAGNOSIS — R7303 Prediabetes: Secondary | ICD-10-CM

## 2019-07-31 DIAGNOSIS — E785 Hyperlipidemia, unspecified: Secondary | ICD-10-CM | POA: Diagnosis not present

## 2019-07-31 DIAGNOSIS — Z853 Personal history of malignant neoplasm of breast: Secondary | ICD-10-CM

## 2019-07-31 NOTE — Patient Instructions (Signed)
Nice to see you. We will get lab work today. We will have GI contact you regarding your endoscopy. Please call to schedule your mammogram. Please increase your fluid intake.

## 2019-08-01 LAB — COMPREHENSIVE METABOLIC PANEL
ALT: 22 U/L (ref 0–35)
AST: 27 U/L (ref 0–37)
Albumin: 4.5 g/dL (ref 3.5–5.2)
Alkaline Phosphatase: 74 U/L (ref 39–117)
BUN: 23 mg/dL (ref 6–23)
CO2: 31 mEq/L (ref 19–32)
Calcium: 10.3 mg/dL (ref 8.4–10.5)
Chloride: 102 mEq/L (ref 96–112)
Creatinine, Ser: 0.71 mg/dL (ref 0.40–1.20)
GFR: 79.58 mL/min (ref >60.00)
Glucose, Bld: 81 mg/dL (ref 70–99)
Potassium: 4.3 mEq/L (ref 3.5–5.1)
Sodium: 141 mEq/L (ref 135–145)
Total Bilirubin: 0.4 mg/dL (ref 0.2–1.2)
Total Protein: 7 g/dL (ref 6.0–8.3)

## 2019-08-01 LAB — HEMOGLOBIN A1C: Hgb A1c MFr Bld: 5.9 % (ref 4.6–6.5)

## 2019-08-02 DIAGNOSIS — R42 Dizziness and giddiness: Secondary | ICD-10-CM | POA: Insufficient documentation

## 2019-08-02 NOTE — Assessment & Plan Note (Signed)
Check A1c and glucose.  Possibly with some low blood sugars despite not being on medication.  She will monitor

## 2019-08-02 NOTE — Assessment & Plan Note (Signed)
Description is consistent with orthostasis.  Orthostatics negative today though.  Discussed adequate hydration and monitoring.  If this is persistent she will let us know.

## 2019-08-02 NOTE — Assessment & Plan Note (Signed)
Refer back to GI to consider EGD. 

## 2019-08-02 NOTE — Assessment & Plan Note (Signed)
Continue simvastatin. 

## 2019-08-02 NOTE — Assessment & Plan Note (Signed)
Mammogram ordered She will call to schedule 

## 2019-08-02 NOTE — Progress Notes (Signed)
Tommi Rumps, MD Phone: [phone]  Stephanie Mora is a 78 y.o. female who presents today for follow-up.  Hyperlipidemia: Taking simvastatin.  No chest pain, right upper quadrant pain, or myalgias.  Dysphagia: This has not been an issue recently.  She has been avoiding foods that trigger is.  She has rare reflux.  No blood in her stool.  No abdominal pain.  Her EGD was canceled related to the COVID-19 pandemic and she would like to get back in for this to be done.  Lightheadedness: Patient notes this occurs briefly when she is outside and has been sweating quite a bit.  She will get up and then get lightheaded.   Prediabetes: Patient does note at times that she will feel shaky all over.  She thinks her blood sugar is dropping with this and she will eat something and this will improve.  Social History   Tobacco Use  Smoking Status Former Smoker  . Years: 25.00  . Types: Cigarettes  . Quit date: 12/20/1993  . Years since quitting: 25.6  Smokeless Tobacco Never Used     ROS see history of present illness  Objective  Physical Exam Vitals:   07/31/19 1448  BP: 125/80  Pulse: 73  Temp: (!) 97.4 F (36.3 C)  SpO2: 98%    BP Readings from Last 3 Encounters:  07/31/19 125/80  06/13/19 (!) 169/88  05/23/19 (!) 166/85   Wt Readings from Last 3 Encounters:  07/31/19 167 lb (75.8 kg)  06/13/19 162 lb (73.5 kg)  05/23/19 162 lb (73.5 kg)    Physical Exam Constitutional:      General: She is not in acute distress.    Appearance: She is not diaphoretic.  Cardiovascular:     Rate and Rhythm: Normal rate and regular rhythm.     Heart sounds: Normal heart sounds.  Pulmonary:     Effort: Pulmonary effort is normal.     Breath sounds: Normal breath sounds.  Musculoskeletal:     Right lower leg: No edema.     Left lower leg: No edema.  Skin:    General: Skin is warm and dry.  Neurological:     Mental Status: She is alert.      Assessment/Plan: Please see  individual problem list.  Dysphagia Refer back to GI to consider EGD.  Hyperlipidemia Continue simvastatin.  Pre-diabetes Check A1c and glucose.  Possibly with some low blood sugars despite not being on medication.  She will monitor  History of right breast cancer Mammogram ordered.  She will call to schedule.  Orthostatic lightheadedness Description is consistent with orthostasis.  Orthostatics negative today though.  Discussed adequate hydration and monitoring.  If this is persistent she will let us know.   Orders Placed This Encounter  Procedures  . MM 3D SCREEN BREAST BILATERAL    Standing Status:   Future    Standing Expiration Date:   09/29/2020    Order Specific Question:   Reason for Exam (SYMPTOM  OR DIAGNOSIS REQUIRED)    Answer:   Breast cancer screening    Order Specific Question:   Preferred imaging location?    Answer:   [facility]  . HgB A1c  . Comp Met (CMET)  . Ambulatory referral to Gastroenterology    Referral Priority:   Routine    Referral Type:   Consultation    Referral Reason:   Specialty Services Required    Number of Visits Requested:   1    No  orders of the defined types were placed in this encounter.    Tommi Rumps, MD [place]

## 2019-08-14 ENCOUNTER — Ambulatory Visit
Admission: RE | Admit: 2019-08-14 | Discharge: 2019-08-14 | Disposition: A | Discharge status: Home / Self Care | Payer: Medicare Other | Source: Ambulatory Visit | Attending: Family Medicine | Admitting: Family Medicine

## 2019-08-14 ENCOUNTER — Other Ambulatory Visit: Payer: Self-pay

## 2019-08-14 DIAGNOSIS — R922 Inconclusive mammogram: Secondary | ICD-10-CM | POA: Diagnosis not present

## 2019-08-14 DIAGNOSIS — N6311 Unspecified lump in the right breast, upper outer quadrant: Secondary | ICD-10-CM | POA: Diagnosis not present

## 2019-08-14 DIAGNOSIS — N6310 Unspecified lump in the right breast, unspecified quadrant: Secondary | ICD-10-CM

## 2019-08-14 DIAGNOSIS — Z1231 Encounter for screening mammogram for malignant neoplasm of breast: Secondary | ICD-10-CM

## 2019-08-16 ENCOUNTER — Other Ambulatory Visit: Payer: Self-pay | Admitting: Family Medicine

## 2019-08-16 ENCOUNTER — Encounter: Payer: Self-pay | Admitting: Family Medicine

## 2019-08-16 DIAGNOSIS — Z01812 Encounter for preprocedural laboratory examination: Secondary | ICD-10-CM | POA: Diagnosis not present

## 2019-08-21 ENCOUNTER — Encounter: Payer: Self-pay | Admitting: Family Medicine

## 2019-08-21 DIAGNOSIS — R1310 Dysphagia, unspecified: Secondary | ICD-10-CM | POA: Diagnosis not present

## 2019-08-21 DIAGNOSIS — K2970 Gastritis, unspecified, without bleeding: Secondary | ICD-10-CM | POA: Diagnosis not present

## 2019-08-21 DIAGNOSIS — K222 Esophageal obstruction: Secondary | ICD-10-CM | POA: Diagnosis not present

## 2019-08-21 DIAGNOSIS — R12 Heartburn: Secondary | ICD-10-CM | POA: Diagnosis not present

## 2019-09-21 ENCOUNTER — Other Ambulatory Visit: Payer: Self-pay | Admitting: Family Medicine

## 2019-09-25 DIAGNOSIS — H524 Presbyopia: Secondary | ICD-10-CM | POA: Diagnosis not present

## 2019-10-02 DIAGNOSIS — K2970 Gastritis, unspecified, without bleeding: Secondary | ICD-10-CM | POA: Diagnosis not present

## 2019-10-02 DIAGNOSIS — R0789 Other chest pain: Secondary | ICD-10-CM | POA: Diagnosis not present

## 2019-10-02 DIAGNOSIS — R142 Eructation: Secondary | ICD-10-CM | POA: Diagnosis not present

## 2019-10-02 DIAGNOSIS — K2100 Gastro-esophageal reflux disease with esophagitis, without bleeding: Secondary | ICD-10-CM | POA: Diagnosis not present

## 2019-10-04 ENCOUNTER — Ambulatory Visit (INDEPENDENT_AMBULATORY_CARE_PROVIDER_SITE_OTHER): Payer: Medicare Other

## 2019-10-04 ENCOUNTER — Other Ambulatory Visit: Payer: Self-pay

## 2019-10-04 DIAGNOSIS — Z23 Encounter for immunization: Secondary | ICD-10-CM

## 2019-10-09 DIAGNOSIS — Z961 Presence of intraocular lens: Secondary | ICD-10-CM | POA: Diagnosis not present

## 2019-11-07 ENCOUNTER — Ambulatory Visit (INDEPENDENT_AMBULATORY_CARE_PROVIDER_SITE_OTHER): Payer: Medicare Other

## 2019-11-07 ENCOUNTER — Telehealth: Payer: Self-pay

## 2019-11-07 ENCOUNTER — Other Ambulatory Visit: Payer: Self-pay

## 2019-11-07 DIAGNOSIS — Z0000 Encounter for general adult medical examination without abnormal findings: Secondary | ICD-10-CM | POA: Diagnosis not present

## 2019-11-07 NOTE — Telephone Encounter (Signed)
Patient returned a call to Guy Sandifer regarding her [place] per patient she does not think shew need this visit because she attends her medical appointments on a regular basis but say that she can be reached at Ph# [phone] for the rest of the day.

## 2019-11-07 NOTE — Patient Instructions (Addendum)
  Ms. Molter , Thank you for taking time to come for your Medicare Wellness Visit. I appreciate your ongoing commitment to your health goals. Please review the following plan we discussed and let me know if I can assist you in the future.   These are the goals we discussed: Goals      Patient Stated   . Increase physical activity (pt-stated)     I would like to walk more, exercise to video tapes, and keep my weight between 160-165lb.        This is a list of the screening recommended for you and due dates:  Health Maintenance  Topic Date Due  . Tetanus Vaccine  04/05/2021  . Flu Shot  Completed  . DEXA scan (bone density measurement)  Completed  . Pneumonia vaccines  Completed

## 2019-11-07 NOTE — Progress Notes (Signed)
Subjective:   Stephanie Mora is a 78 y.o. female who presents for Medicare Annual (Subsequent) preventive examination.  Review of Systems:  No ROS.  Medicare Wellness Virtual Visit.  Visual/audio telehealth visit, UTA vital signs.   See social history for additional risk factors.   Cardiac Risk Factors include: advanced age (>80men, >60 women)     Objective:     Vitals: There were no vitals taken for this visit.  There is no height or weight on file to calculate BMI.  Advanced Directives 11/07/2019 06/13/2019 05/23/2019  Does Patient Have a Medical Advance Directive? Yes Yes Yes  Type of Paramedic of [place];Living will [place];Living will Living will;Healthcare Power of Attorney  Does patient want to make changes to medical advance directive? No - Patient declined No - Patient declined No - Patient declined  Copy of [place] in Chart? No - copy requested No - copy requested No - copy requested    Tobacco Social History   Tobacco Use  Smoking Status Former Smoker  . Years: 25.00  . Types: Cigarettes  . Quit date: 12/20/1993  . Years since quitting: 25.8  Smokeless Tobacco Never Used     Counseling given: Not Answered   Clinical Intake:  Pre-visit preparation completed: Yes        Diabetes: No  How often do you need to have someone help you when you read instructions, pamphlets, or other written materials from your doctor or pharmacy?: 1 - Never  Interpreter Needed?: No     Past Medical History:  Diagnosis Date  . Bilateral dry eyes   . Breast cancer ([place]) 2000   breast right, lumpectomy and XRT at [facility].ER/PR positive INVASIVE WELL DIFFERENTIATED DUCTAL ADENOCARCINOMA, TUBULAR TYPE.  Marland Kitchen Heart murmur    as child  . Hyperlipidemia   . Osteoporosis    reclast treatment in past  . Personal history of radiation therapy 2000   F/U right breast cancer   Past Surgical History:  Procedure  Laterality Date  . BREAST BIOPSY Right 2000   +  . BREAST BIOPSY Left 1980's   neg, Dr Bary Castilla  . BREAST LUMPECTOMY Right 2000   at [facility]  . CATARACT EXTRACTION W/PHACO Right 05/23/2019   Procedure: CATARACT EXTRACTION PHACO AND INTRAOCULAR LENS PLACEMENT ([place])  RIGHT TORIC LENS;  Surgeon: Leandrew Koyanagi, MD;  Location: [facility];  Service: Ophthalmology;  Laterality: Right;  . CATARACT EXTRACTION W/PHACO Left 06/13/2019   Procedure: CATARACT EXTRACTION PHACO AND INTRAOCULAR LENS PLACEMENT ([place]) LEFT TORIC LENS;  Surgeon: Leandrew Koyanagi, MD;  Location: [facility];  Service: Ophthalmology;  Laterality: Left;   Family History  Problem Relation Age of Onset  . Cancer Mother   . Breast cancer Mother 56  . Heart disease Father   . Heart attack Father 50  . Cancer Maternal Aunt   . Breast cancer Maternal Aunt        ? age   Social History   Socioeconomic History  . Marital status: Married    Spouse name: Not on file  . Number of children: Not on file  . Years of education: Not on file  . Highest education level: Not on file  Occupational History  . Not on file  Social Needs  . Financial resource strain: Not hard at all  . Food insecurity    Worry: Never true    Inability: Never true  . Transportation needs  Medical: No    Non-medical: No  Tobacco Use  . Smoking status: Former Smoker    Years: 25.00    Types: Cigarettes    Quit date: 12/20/1993    Years since quitting: 25.8  . Smokeless tobacco: Never Used  Substance and Sexual Activity  . Alcohol use: Yes    Comment: occasional  . Drug use: No  . Sexual activity: Not on file  Lifestyle  . Physical activity    Days per week: Not on file    Minutes per session: Not on file  . Stress: Not at all  Relationships  . Social Herbalist on phone: Not on file    Gets together: Not on file    Attends religious service: Not on file    Active member of club or organization: Not on file     Attends meetings of clubs or organizations: Not on file    Relationship status: Not on file  Other Topics Concern  . Not on file  Social History Narrative   Lives in [place] with husband.      Diet - healthy diet   Exercise - [employer] weight training    Outpatient Encounter Medications as of 11/07/2019  Medication Sig  . Acetylcysteine (N-ACETYL-L-CYSTEINE PO) Take 600 mcg by mouth 2 (two) times daily.  Marland Kitchen aspirin 81 MG tablet Take 81 mg by mouth daily.  . Cholecalciferol (VITAMIN D3) 2000 UNITS TABS Take 1 tablet by mouth daily.  . Coenzyme Q10 (COQ10) 100 MG CAPS Take 1 capsule by mouth daily.  . Emollient (COLLAGEN EX) Apply topically.  . fluticasone (FLONASE) 50 MCG/ACT nasal spray Place 2 sprays into the nose as needed.  Marland Kitchen MISC NATURAL PRODUCT OP Take by mouth. Tumeric 500mg    . Multiple Vitamin (MULTIVITAMIN) tablet Take 1 tablet by mouth daily.  Vladimir Faster Glycol-Propyl Glycol (SYSTANE) 0.4-0.3 % SOLN Apply to eye.  . simvastatin (ZOCOR) 20 MG tablet TAKE 1 TABLET BY MOUTH EVERY EVENING  . venlafaxine XR (EFFEXOR-XR) 150 MG 24 hr capsule TAKE 1 CAPSULE BY MOUTH EVERY DAY  . vitamin C (ASCORBIC ACID) 500 MG tablet Take 500 mg by mouth 2 (two) times daily.   No facility-administered encounter medications on file as of 11/07/2019.     Activities of Daily Living In your present state of health, do you have any difficulty performing the following activities: 11/07/2019 06/13/2019  Hearing? N N  Vision? N N  Difficulty concentrating or making decisions? N N  Walking or climbing stairs? N N  Dressing or bathing? N N  Doing errands, shopping? N -  Preparing Food and eating ? N -  Using the Toilet? N -  In the past six months, have you accidently leaked urine? N -  Do you have problems with loss of bowel control? N -  Managing your Medications? N -  Managing your Finances? N -  Housekeeping or managing your Housekeeping? N -  Some recent data might be hidden     Patient Care Team: Leone Haven, MD as PCP - General (Family Medicine)    Assessment:   This is a routine wellness examination for [place].  Nurse connected with patient 11/07/19 at 12:30 PM EST by a telephone enabled telemedicine application and verified that I am speaking with the correct person using two identifiers. Patient stated full name and DOB. Patient gave permission to continue with virtual visit. Patient's location was at home and Nurse's location was at [facility]  office.   Health Maintenance Due: See completed HM at the end of note.   Eye: Visual acuity not assessed. Virtual visit. Wears corrective lenses. Followed by their ophthalmologist.   Dental: Visits every 6 months.    Hearing: Demonstrates normal hearing during visit.  Safety:  Patient feels safe at home- yes Patient does have smoke detectors at home- yes Patient does wear sunscreen or protective clothing when in direct sunlight - yes Patient does wear seat belt when in a moving vehicle - yes Patient drives- yes Adequate lighting in walkways free from debris- yes Grab bars and handrails used as appropriate- yes Ambulates with no assistive device Cell phone on person when ambulating outside of the home- yes  Social: Alcohol intake - yes      Smoking history- former    Smokers in home? none Illicit drug use? none  Depression: PHQ 2 &9 complete. See screening below. Denies irritability, anhedonia, sadness/tearfullness.    Falls: See screening below.    Medication: Taking as directed and without issues.   Covid-19: Precautions and sickness symptoms discussed. Wears mask, social distancing, hand hygiene as appropriate.   Activities of Daily Living Patient denies needing assistance with: household chores, feeding themselves, getting from bed to chair, getting to the toilet, bathing/showering, dressing, managing money, or preparing meals.   Memory: Patient is alert. Patient denies difficulty  focusing or concentrating. Correctly identified the president of the [place], season and recall. Patient likes to read, sew quilts, and zoom on Bible study for brain stimulation.   BMI- discussed the importance of a healthy diet, water intake and the benefits of aerobic exercise.  Educational material provided.  Physical activity- active around the home, no routine.   Diet:  Regular Water: fair intake  Other Providers Patient Care Team: Leone Haven, MD as PCP - General (Family Medicine)  Exercise Activities and Dietary recommendations Current Exercise Habits: Home exercise routine, Type of exercise: stretching;walking, Intensity: Mild  Goals      Patient Stated   . Increase physical activity (pt-stated)     I would like to walk more, exercise to video tapes, and keep my weight between 160-165lb.        Fall Risk Fall Risk  11/07/2019 07/31/2019 07/28/2018 06/27/2017 06/23/2016  Falls in the past year? 0 0 No No No  Number falls in past yr: 0 0 - - -  Follow up Falls prevention discussed;Education provided - - - -   Timed Get Up and Go performed: no, virtual visit  Depression Screen PHQ 2/9 Scores 11/07/2019 07/31/2019 06/27/2017 06/23/2016  PHQ - 2 Score 0 0 0 0     Cognitive Function     6CIT Screen 11/07/2019  What Year? 0 points  What month? 0 points  What time? 0 points  Count back from 20 0 points  Months in reverse 0 points  Repeat phrase 0 points  Total Score 0    Immunization History  Administered Date(s) Administered  . Fluad Quad(high Dose 65+) 10/04/2019  . Influenza, High Dose Seasonal PF 10/11/2014, 11/01/2015, 10/27/2016, 10/15/2017, 10/26/2018  . Influenza-Unspecified 10/26/2012, 10/29/2013, 10/26/2014, 10/28/2015  . Pneumococcal Conjugate-13 05/23/2014  . Pneumococcal Polysaccharide-23 07/06/2011  . Tdap 04/06/2011  . Zoster 07/06/2011   Screening Tests Health Maintenance  Topic Date Due  . TETANUS/TDAP  04/05/2021  . INFLUENZA VACCINE   Completed  . DEXA SCAN  Completed  . PNA vac Low Risk Adult  Completed      Plan:   Keep  all routine maintenance appointments.   Follow up 02/01/20 @ 245  Medicare Attestation I have personally reviewed: The patient's medical and social history Their use of alcohol, tobacco or illicit drugs Their current medications and supplements The patient's functional ability including ADLs,fall risks, home safety risks, cognitive, and hearing and visual impairment Diet and physical activities Evidence for depression   In addition, I have reviewed and discussed with patient certain preventive protocols, quality metrics, and best practice recommendations. A written personalized care plan for preventive services as well as general preventive health recommendations were provided to patient via mail.     Varney Biles, LPN  [phone]

## 2019-11-07 NOTE — Telephone Encounter (Signed)
Unable to reach patient for annual wellness appointment. Left message to call me back or reschedule.

## 2019-11-07 NOTE — Telephone Encounter (Signed)
Noted  

## 2019-11-23 ENCOUNTER — Other Ambulatory Visit: Payer: Self-pay | Admitting: Family Medicine

## 2019-11-23 MED ORDER — SIMVASTATIN 20 MG PO TABS: 20.0000 mg | ORAL_TABLET | Freq: Every evening | ORAL | 0 refills | Status: DC

## 2019-11-23 NOTE — Telephone Encounter (Signed)
Medication Refill - Medication: simvastatin   Has the patient contacted their pharmacy? Yes.   (Agent: If no, request that the patient contact the pharmacy for the refill.) (Agent: If yes, when and what did the pharmacy advise?)  Preferred Pharmacy (with phone number or street name) [facility] [place], [place] - [place]  [address] [place] [address]  Phone: [phone] Fax: [phone]  Not a 24 hour pharmacy; exact hours not known.   :   Agent: Please be advised that RX refills may take up to 3 business days. We ask that you follow-up with your pharmacy.

## 2019-12-24 ENCOUNTER — Other Ambulatory Visit: Payer: Self-pay | Admitting: Family Medicine

## 2019-12-30 IMAGING — MG DIGITAL DIAGNOSTIC UNILATERAL RIGHT MAMMOGRAM WITH TOMO AND CAD
4 series · 4 of 12 positions shown · non-contrast
Comparison: Previous exam(s).

CLINICAL DATA: History of right breast cancer status post
lumpectomy in 1222. Patient's physician palpated an abnormality in
the lumpectomy site. Patient states that she thinks it is clinically
stable.

EXAM:
DIGITAL DIAGNOSTIC RIGHT MAMMOGRAM WITH CAD AND TOMO
ULTRASOUND RIGHT BREAST

[R MLO synth-2D]
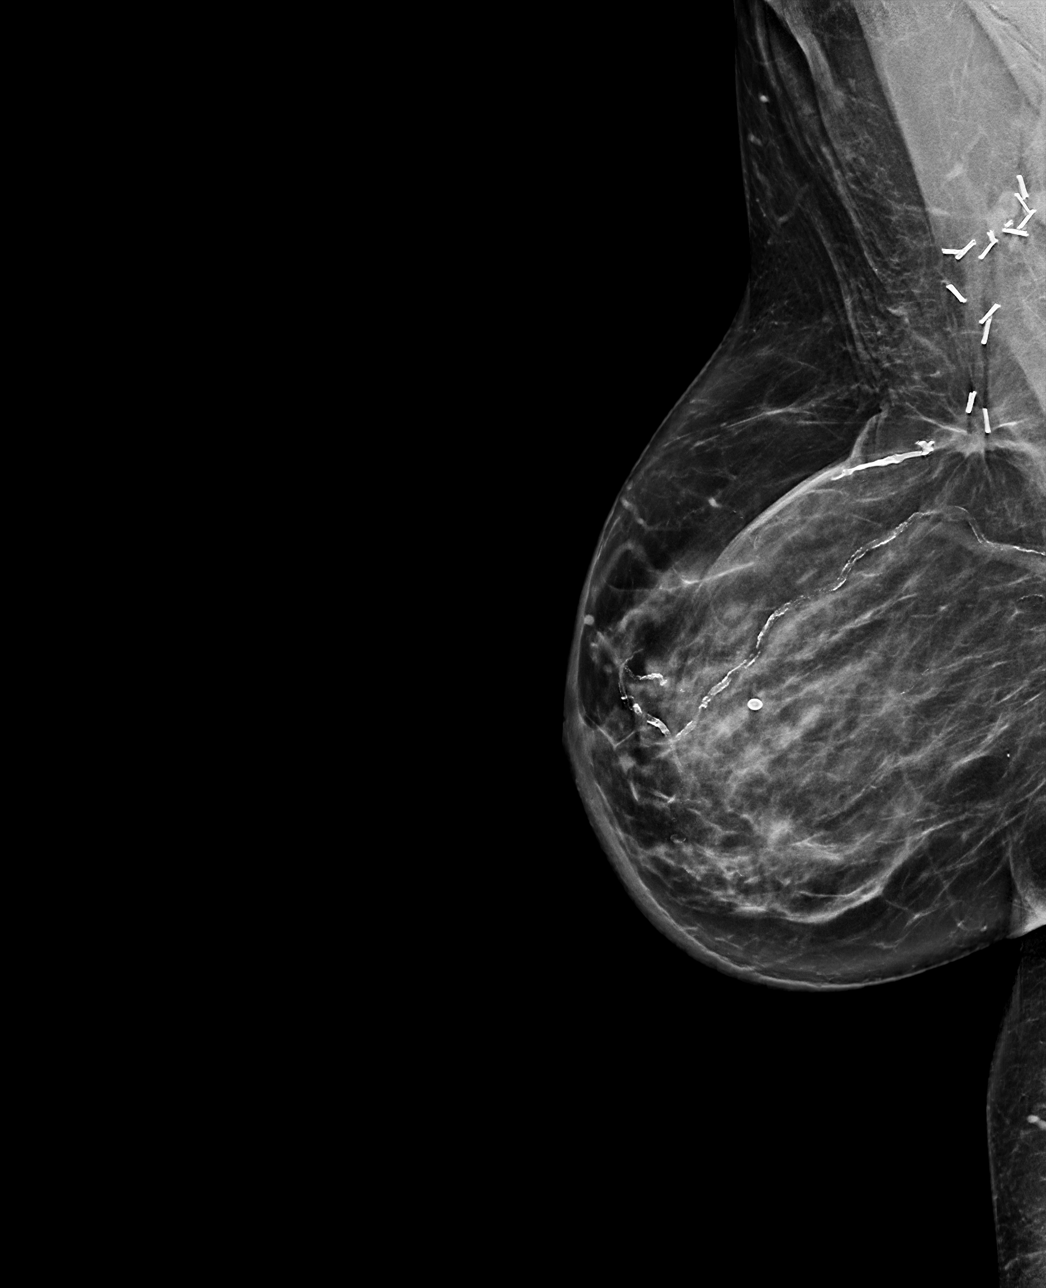

[R CC synth-2D]
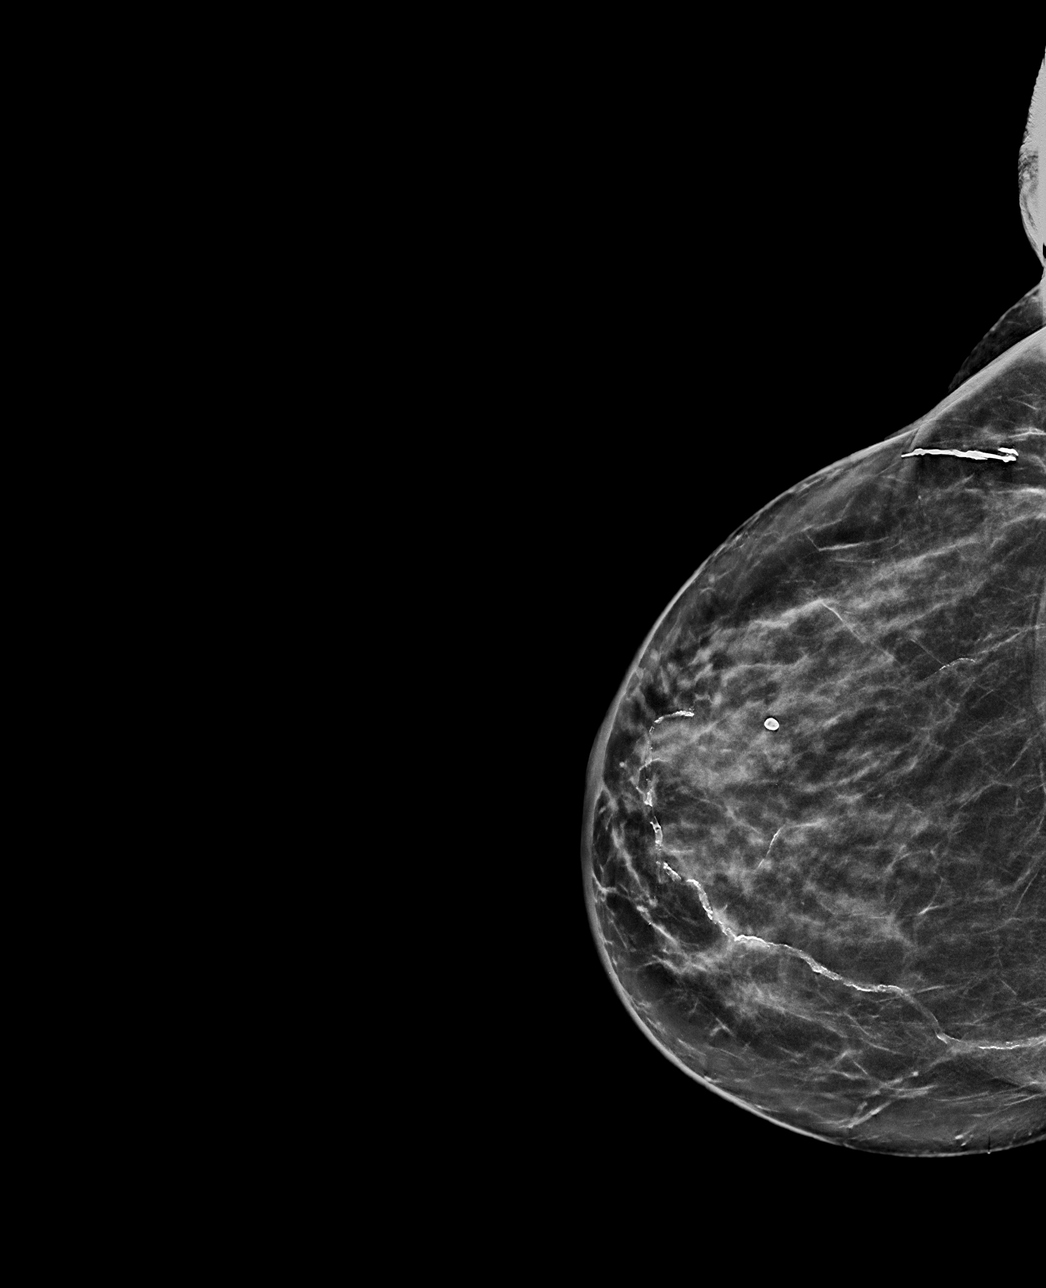

[R CC tomo · tomo slice 35/68.0]
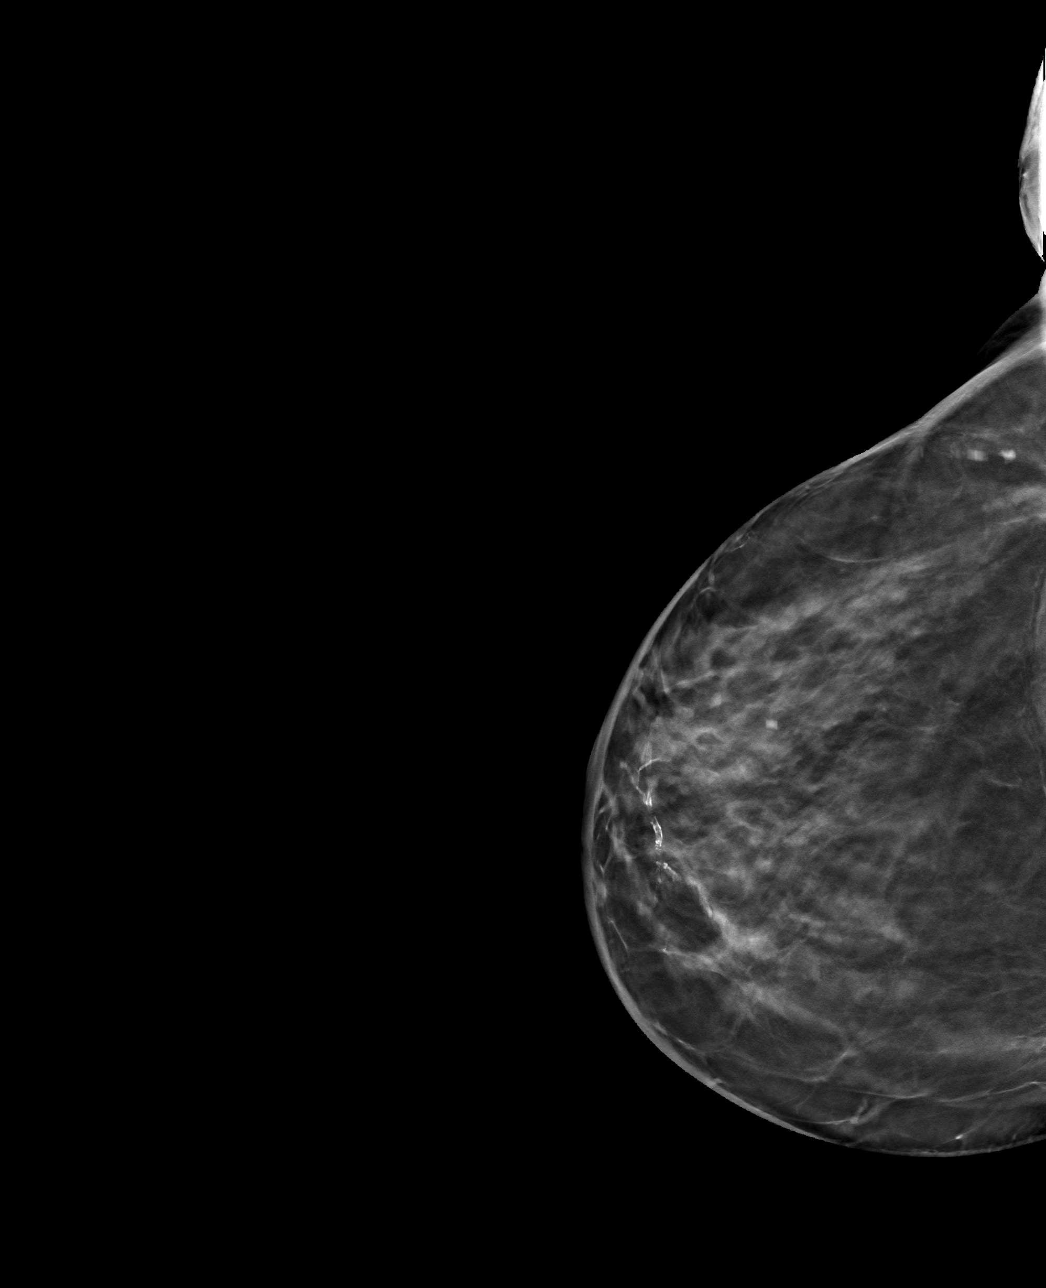

[R MLO tomo · tomo slice 38/75.0]
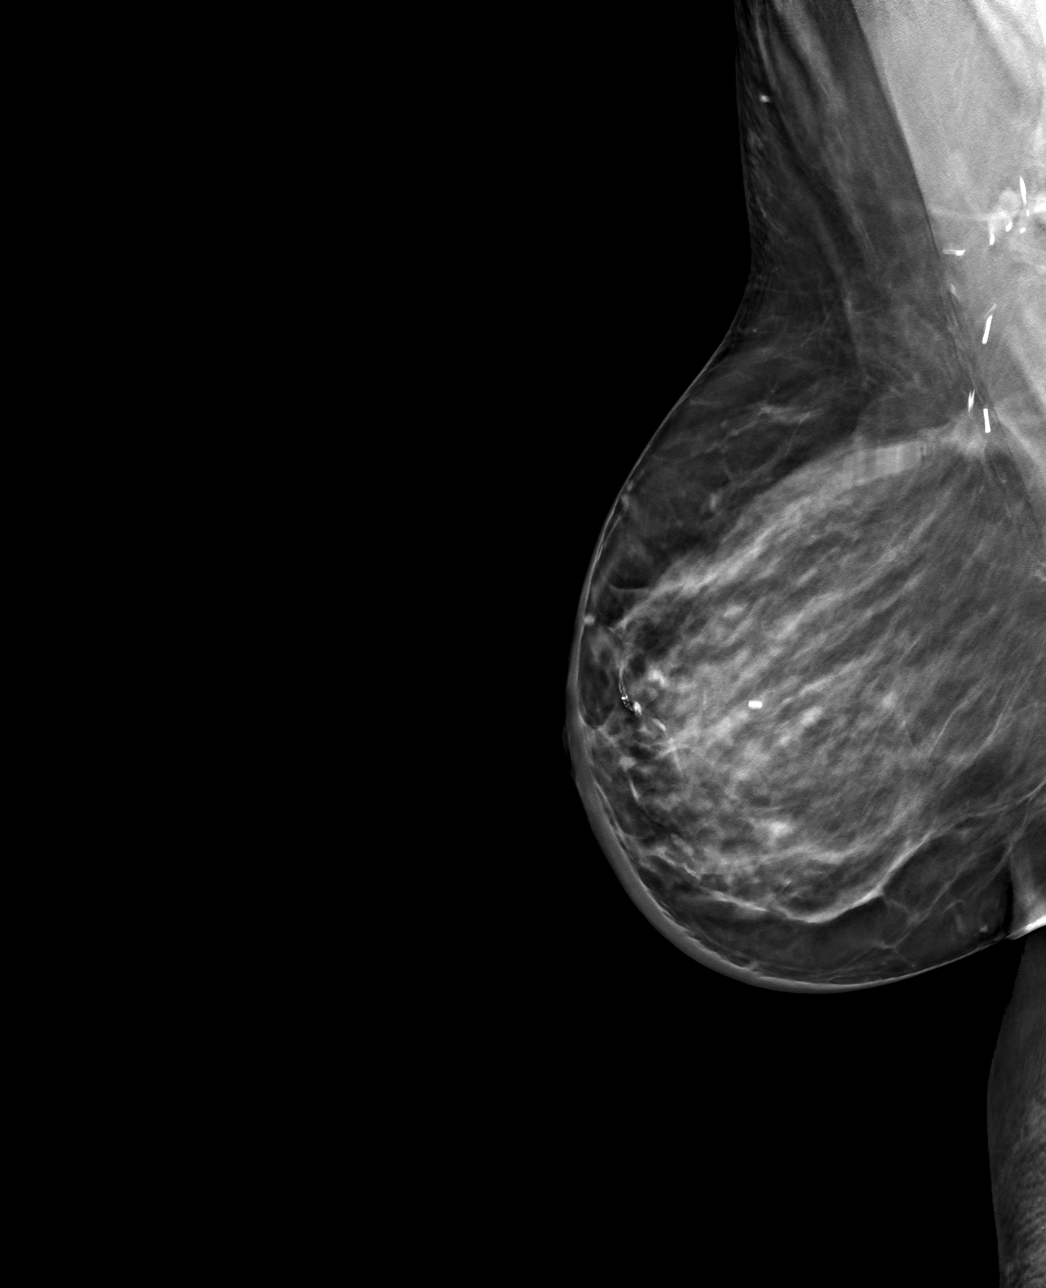

[4 of 12 positions shown; findings below may reference images not displayed]

ACR Breast Density Category c: The breast tissue is heterogeneously
dense, which may obscure small masses.
FINDINGS: Stable lumpectomy changes are seen in the right breast. No
suspicious mass or malignant type microcalcifications identified.

Mammographic images were processed with CAD.

On physical exam, I do not palpate a discrete mass in the 10 o'clock
region of the right breast at the lumpectomy site.

Targeted ultrasound is performed, showing shadowing and distortion
in the lumpectomy site of the right breast at 10 o'clock 8 cm from
the nipple. This is felt to likely be scar tissue.
IMPRESSION: Probable benign lumpectomy changes in the 10 o'clock region of the
right breast.

RECOMMENDATION:
Bilateral diagnostic mammogram in Thursday March, 2019 is recommended.

I have discussed the findings and recommendations with the patient.
Results were also provided in writing at the conclusion of the
visit. If applicable, a reminder letter will be sent to the patient
regarding the next appointment.

BI-RADS CATEGORY  3: Probably benign.

## 2019-12-30 IMAGING — US ULTRASOUND RIGHT BREAST LIMITED
1 series · 2 of 2 positions shown · non-contrast
Comparison: Previous exam(s).

CLINICAL DATA: History of right breast cancer status post
lumpectomy in 1222. Patient's physician palpated an abnormality in
the lumpectomy site. Patient states that she thinks it is clinically
stable.

EXAM:
DIGITAL DIAGNOSTIC RIGHT MAMMOGRAM WITH CAD AND TOMO
ULTRASOUND RIGHT BREAST

[Series 1: ultrasound right breast limited · 0.05mm/px · 2 of 2 slices shown]
[im 1/2]
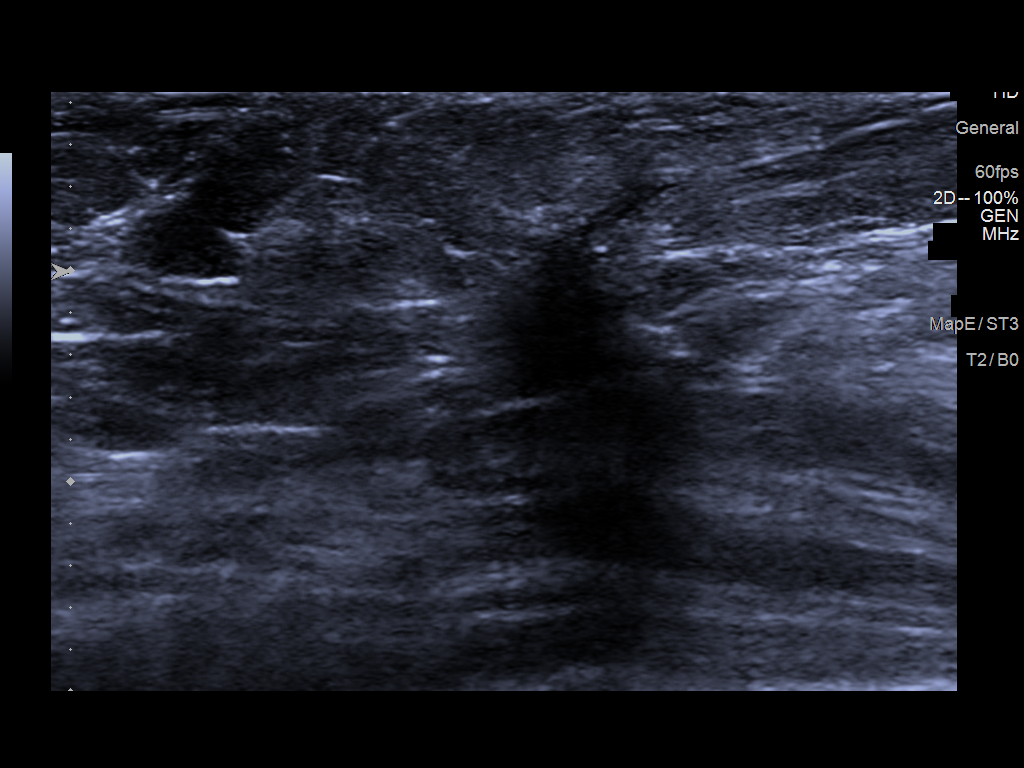
[im 2/2]
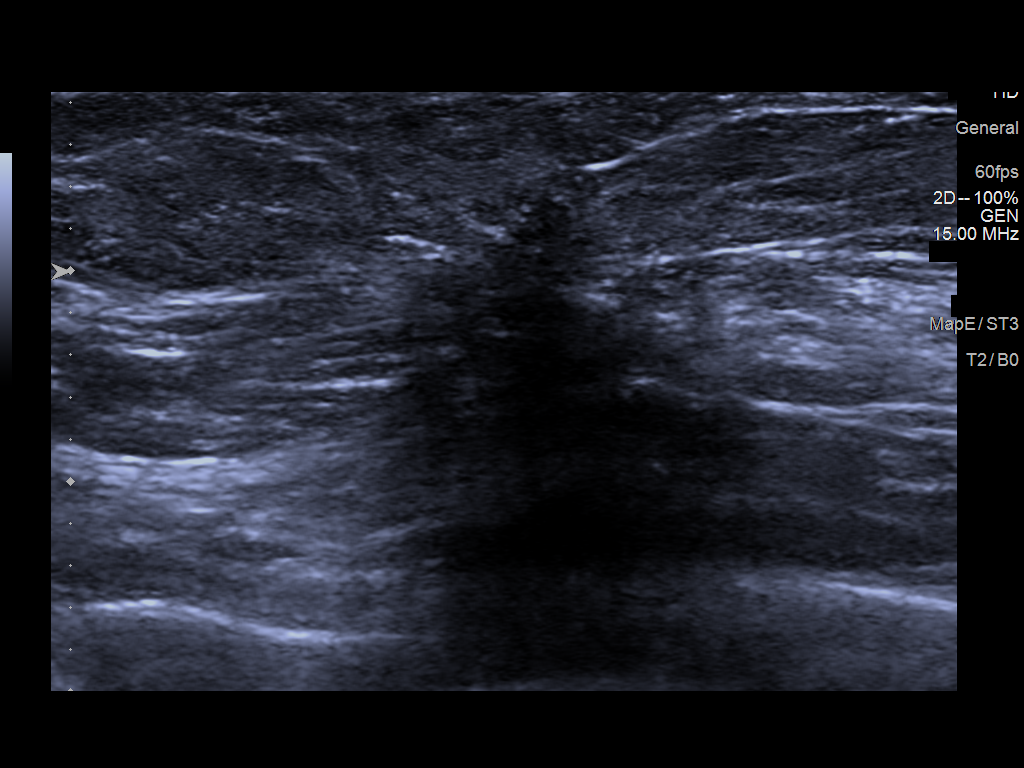

[2 of 2 positions shown; findings below may reference images not displayed]

ACR Breast Density Category c: The breast tissue is heterogeneously
dense, which may obscure small masses.
FINDINGS: Stable lumpectomy changes are seen in the right breast. No
suspicious mass or malignant type microcalcifications identified.

Mammographic images were processed with CAD.

On physical exam, I do not palpate a discrete mass in the 10 o'clock
region of the right breast at the lumpectomy site.

Targeted ultrasound is performed, showing shadowing and distortion
in the lumpectomy site of the right breast at 10 o'clock 8 cm from
the nipple. This is felt to likely be scar tissue.
IMPRESSION: Probable benign lumpectomy changes in the 10 o'clock region of the
right breast.

RECOMMENDATION:
Bilateral diagnostic mammogram in Thursday March, 2019 is recommended.

I have discussed the findings and recommendations with the patient.
Results were also provided in writing at the conclusion of the
visit. If applicable, a reminder letter will be sent to the patient
regarding the next appointment.

BI-RADS CATEGORY  3: Probably benign.

## 2020-01-03 ENCOUNTER — Encounter: Payer: Self-pay | Admitting: Family Medicine

## 2020-01-04 NOTE — Telephone Encounter (Signed)
Noted. Thank you for following up on this for me.

## 2020-01-04 NOTE — Telephone Encounter (Signed)
Called and spoke with [facility] and she is going to call and talk with patient if bedroom doors were open and possibly the bat was in there during the night then recommendation is to have the rabies injections.

## 2020-01-07 ENCOUNTER — Telehealth: Payer: Self-pay

## 2020-01-08 ENCOUNTER — Other Ambulatory Visit: Payer: Self-pay

## 2020-01-08 ENCOUNTER — Emergency Department
Admission: EM | Admit: 2020-01-08 | Discharge: 2020-01-08 | Disposition: A | Discharge status: Home / Self Care | Payer: Medicare Other | Attending: Emergency Medicine | Admitting: Emergency Medicine

## 2020-01-08 DIAGNOSIS — Z203 Contact with and (suspected) exposure to rabies: Secondary | ICD-10-CM | POA: Diagnosis not present

## 2020-01-08 DIAGNOSIS — Z79899 Other long term (current) drug therapy: Secondary | ICD-10-CM | POA: Diagnosis not present

## 2020-01-08 DIAGNOSIS — Z87891 Personal history of nicotine dependence: Secondary | ICD-10-CM | POA: Diagnosis not present

## 2020-01-08 DIAGNOSIS — Z7982 Long term (current) use of aspirin: Secondary | ICD-10-CM | POA: Diagnosis not present

## 2020-01-08 NOTE — ED Triage Notes (Signed)
Pt reports a bat was in her house 12/21/19.  Here to make sure may not need a vaccine. No sx.

## 2020-01-08 NOTE — ED Notes (Signed)
See triage note  States they have had a bat in their house  Denies any contant

## 2020-01-08 NOTE — ED Notes (Signed)
ED Provider at bedside. 

## 2020-01-08 NOTE — Discharge Instructions (Addendum)
Your contact was greater than 18 days ago signs or symptoms of rabies.  There were no vitals scratches.Marland Kitchen

## 2020-01-08 NOTE — ED Provider Notes (Signed)
[facility] Emergency Department Provider Note   ____________________________________________   First MD Initiated Contact with Patient 01/08/20 1212     (approximate)  I have reviewed the triage vital signs and the nursing notes.   HISTORY  Chief Complaint bat exposure    HPI Stephanie Mora is a 79 y.o. female patient presents with brief exposure to bat the flu in our house a few days ago.  Patient sustained no bites or scratches from the counter.  Patient states no signs and symptoms of rabies per [facility] website they visited.  Was concerned about taking prophylactic rabies injections.         Past Medical History:  Diagnosis Date  . Bilateral dry eyes   . Breast cancer ([place]) 2000   breast right, lumpectomy and XRT at [facility].ER/PR positive INVASIVE WELL DIFFERENTIATED DUCTAL ADENOCARCINOMA, TUBULAR TYPE.  Marland Kitchen Heart murmur    as child  . Hyperlipidemia   . Osteoporosis    reclast treatment in past  . Personal history of radiation therapy 2000   F/U right breast cancer    Patient Active Problem List   Diagnosis Date Noted  . Orthostatic lightheadedness 08/02/2019  . Dysphagia 01/29/2019  . Rash 07/28/2018  . Stress incontinence 12/31/2017  . Hearing difficulty 12/31/2017  . Changes in vision 12/31/2017  . Pre-diabetes 06/27/2017  . Skin nodule 06/27/2017  . Encounter for general adult medical examination with abnormal findings 12/27/2016  . Tremor 12/27/2016  . GERD (gastroesophageal reflux disease) 12/27/2016  . Trigger finger, acquired 12/05/2015  . Abnormal EKG 05/31/2014  . Osteoporosis 05/23/2014  . Hyperlipidemia 05/23/2014  . Obesity (BMI 30-39.9) 05/23/2014  . Medicare annual wellness visit, subsequent 05/09/2013  . Depression 07/05/2012  . History of right breast cancer 07/05/2012    Past Surgical History:  Procedure Laterality Date  . BREAST BIOPSY Right 2000   +  . BREAST BIOPSY Left 1980's   neg, Dr Bary Castilla  . BREAST  LUMPECTOMY Right 2000   at [facility]  . CATARACT EXTRACTION W/PHACO Right 05/23/2019   Procedure: CATARACT EXTRACTION PHACO AND INTRAOCULAR LENS PLACEMENT ([place])  RIGHT TORIC LENS;  Surgeon: Leandrew Koyanagi, MD;  Location: [place];  Service: Ophthalmology;  Laterality: Right;  . CATARACT EXTRACTION W/PHACO Left 06/13/2019   Procedure: CATARACT EXTRACTION PHACO AND INTRAOCULAR LENS PLACEMENT ([place]) LEFT TORIC LENS;  Surgeon: Leandrew Koyanagi, MD;  Location: [place];  Service: Ophthalmology;  Laterality: Left;    Prior to Admission medications   Medication Sig Start Date End Date Taking? Authorizing Provider  Acetylcysteine (N-ACETYL-L-CYSTEINE PO) Take 600 mcg by mouth 2 (two) times daily.    [provider]  aspirin 81 MG tablet Take 81 mg by mouth daily.    [provider]  Cholecalciferol (VITAMIN D3) 2000 UNITS TABS Take 1 tablet by mouth daily.    [provider]  Coenzyme Q10 (COQ10) 100 MG CAPS Take 1 capsule by mouth daily.    [provider]  Emollient (COLLAGEN EX) Apply topically.    [provider]  fluticasone (FLONASE) 50 MCG/ACT nasal spray Place 2 sprays into the nose as needed. 05/09/13   Jackolyn Confer, MD  MISC NATURAL PRODUCT OP Take by mouth. Tumeric 500mg      [provider]  Multiple Vitamin (MULTIVITAMIN) tablet Take 1 tablet by mouth daily.    [provider]  Polyethyl Glycol-Propyl Glycol (SYSTANE) 0.4-0.3 % SOLN Apply to eye.    [provider]  simvastatin (  ZOCOR) 20 MG tablet Take 1 tablet (20 mg total) by mouth every evening. 11/23/19   Leone Haven, MD  venlafaxine XR (EFFEXOR-XR) 150 MG 24 hr capsule TAKE 1 CAPSULE BY MOUTH EVERY DAY 12/24/19   Leone Haven, MD  vitamin C (ASCORBIC ACID) 500 MG tablet Take 500 mg by mouth 2 (two) times daily.    [provider]    Allergies Patient has no known allergies.  Family History  Problem Relation Age  of Onset  . Cancer Mother   . Breast cancer Mother 65  . Heart disease Father   . Heart attack Father 42  . Cancer Maternal Aunt   . Breast cancer Maternal Aunt        ? age    Social History Social History   Tobacco Use  . Smoking status: Former Smoker    Years: 25.00    Types: Cigarettes    Quit date: 12/20/1993    Years since quitting: 26.0  . Smokeless tobacco: Never Used  Substance Use Topics  . Alcohol use: Yes    Comment: occasional  . Drug use: No    Review of Systems Constitutional: No fever/chills Eyes: No visual changes. ENT: No sore throat. Cardiovascular: Denies chest pain. Respiratory: Denies shortness of breath. Gastrointestinal: No abdominal pain.  No nausea, no vomiting.  No diarrhea.  No constipation. Genitourinary: Negative for dysuria. Musculoskeletal: Negative for back pain. Skin: Negative for rash. Neurological: Negative for headaches, focal weakness or numbness. Endocrine:  Hyperlipidemia ____________________________________________   PHYSICAL EXAM:  VITAL SIGNS: ED Triage Vitals  Enc Vitals Group     BP 01/08/20 1154 138/74     Pulse Rate 01/08/20 1154 86     Resp 01/08/20 1154 18     Temp 01/08/20 1154 98.3 F (36.8 C)     Temp Source 01/08/20 1154 Oral     SpO2 01/08/20 1154 96 %     Weight 01/08/20 1152 163 lb (73.9 kg)     Height 01/08/20 1152 5\' 2"  (1.575 m)     Head Circumference --      Peak Flow --      Pain Score 01/08/20 1152 0     Pain Loc --      Pain Edu? --      Excl. in [place]? --     Constitutional: Alert and oriented. Well appearing and in no acute distress. Cardiovascular: Normal rate, regular rhythm. Grossly normal heart sounds.  Good peripheral circulation. Respiratory: Normal respiratory effort.  No retractions. Lungs CTAB. Neurologic:  Normal speech and language. No gross focal neurologic deficits are appreciated. No gait instability. Skin:  Skin is warm, dry and intact. No rash noted. Psychiatric: Mood and  affect are normal. Speech and behavior are normal.  ____________________________________________   LABS (all labs ordered are listed, but only abnormal results are displayed)  Labs Reviewed - No data to display ____________________________________________  EKG   ____________________________________________  RADIOLOGY  ED MD interpretation:    Official radiology report(s): No results found.  ____________________________________________   PROCEDURES  Procedure(s) performed (including Critical Care):  Procedures   ____________________________________________   INITIAL IMPRESSION / ASSESSMENT AND PLAN / ED COURSE  As part of my medical decision making, I reviewed the following data within the [place]     Patient presents for evaluation for appropriate prophylaxis secondary to brief exposure to a bat.  Discussed rationale for not taking rabies shots.    Roseanne Kaufman was  evaluated in Emergency Department on 01/08/2020 for the symptoms described in the history of present illness. She was evaluated in the context of the global COVID-19 pandemic, which necessitated consideration that the patient might be at risk for infection with the SARS-CoV-2 virus that causes COVID-19. Institutional protocols and algorithms that pertain to the evaluation of patients at risk for COVID-19 are in a state of rapid change based on information released by regulatory bodies including the CDC and federal and state organizations. These policies and algorithms were followed during the patient's care in the ED.       ____________________________________________   FINAL CLINICAL IMPRESSION(S) / ED DIAGNOSES  Final diagnoses:  Contact with and (suspected) exposure to rabies     ED Discharge Orders    None       Note:  This document was prepared using Dragon voice recognition software and may include unintentional dictation errors.    Sable Feil,  PA-C 01/08/20 1231    Carrie Mew, MD 01/08/20 [phone]

## 2020-02-01 ENCOUNTER — Ambulatory Visit (INDEPENDENT_AMBULATORY_CARE_PROVIDER_SITE_OTHER): Payer: Medicare Other | Admitting: Family Medicine

## 2020-02-01 ENCOUNTER — Other Ambulatory Visit: Payer: Self-pay

## 2020-02-01 ENCOUNTER — Encounter: Payer: Self-pay | Admitting: Family Medicine

## 2020-02-01 VITALS — Ht 62.0 in | Wt 163.0 lb

## 2020-02-01 DIAGNOSIS — E785 Hyperlipidemia, unspecified: Secondary | ICD-10-CM

## 2020-02-01 DIAGNOSIS — M810 Age-related osteoporosis without current pathological fracture: Secondary | ICD-10-CM

## 2020-02-01 DIAGNOSIS — F325 Major depressive disorder, single episode, in full remission: Secondary | ICD-10-CM

## 2020-02-01 DIAGNOSIS — R1310 Dysphagia, unspecified: Secondary | ICD-10-CM

## 2020-02-01 DIAGNOSIS — Z130 Encounter for screening for diseases of the blood and blood-forming organs and certain disorders involving the immune mechanism: Secondary | ICD-10-CM

## 2020-02-01 DIAGNOSIS — Z1329 Encounter for screening for other suspected endocrine disorder: Secondary | ICD-10-CM

## 2020-02-01 DIAGNOSIS — R7303 Prediabetes: Secondary | ICD-10-CM

## 2020-02-01 NOTE — Progress Notes (Signed)
Virtual Visit via telephone Note  This visit type was conducted due to national recommendations for restrictions regarding the COVID-19 pandemic (e.g. social distancing).  This format is felt to be most appropriate for this patient at this time.  All issues noted in this document were discussed and addressed.  No physical exam was performed (except for noted visual exam findings with Video Visits).   I connected with Stephanie Mora today at  2:45 PM EST by telephone and verified that I am speaking with the correct person using two identifiers. Location patient: home Location provider: work  Persons participating in the virtual visit: patient, provider  I discussed the limitations, risks, security and privacy concerns of performing an evaluation and management service by telephone and the availability of in person appointments. I also discussed with the patient that there may be a patient responsible charge related to this service. The patient expressed understanding and agreed to proceed.  Interactive audio and video telecommunications were attempted between this provider and patient, however failed, due to patient having technical difficulties OR patient did not have access to video capability.  We continued and completed visit with audio only.  Reason for visit: follow-up  HPI: HYPERLIPIDEMIA Symptoms Chest pain on exertion:  no   Medications: Compliance- taking simvastatin Right upper quadrant pain- no  Muscle aches- no  Depression: taking venlafaxine. No depression.   Dysphagia: still has some discomfort when swallowing. Has completed evaluation through GI for this. They discussed diet changes and eating smaller sizes of food. Occasionally takes tums. No weight gain or loss. Does not want anything stronger than tums.   BP has been mostly in the 134-145/60-70 range.   ROS: See pertinent positives and negatives per HPI.  Past Medical History:  Diagnosis Date  . Bilateral dry  eyes   . Breast cancer ([place]) 2000   breast right, lumpectomy and XRT at [facility].ER/PR positive INVASIVE WELL DIFFERENTIATED DUCTAL ADENOCARCINOMA, TUBULAR TYPE.  Marland Kitchen Heart murmur    as child  . Hyperlipidemia   . Osteoporosis    reclast treatment in past  . Personal history of radiation therapy 2000   F/U right breast cancer    Past Surgical History:  Procedure Laterality Date  . BREAST BIOPSY Right 2000   +  . BREAST BIOPSY Left 1980's   neg, Dr Bary Castilla  . BREAST LUMPECTOMY Right 2000   at [facility]  . CATARACT EXTRACTION W/PHACO Right 05/23/2019   Procedure: CATARACT EXTRACTION PHACO AND INTRAOCULAR LENS PLACEMENT ([place])  RIGHT TORIC LENS;  Surgeon: Leandrew Koyanagi, MD;  Location: [place];  Service: Ophthalmology;  Laterality: Right;  . CATARACT EXTRACTION W/PHACO Left 06/13/2019   Procedure: CATARACT EXTRACTION PHACO AND INTRAOCULAR LENS PLACEMENT ([place]) LEFT TORIC LENS;  Surgeon: Leandrew Koyanagi, MD;  Location: [place];  Service: Ophthalmology;  Laterality: Left;    Family History  Problem Relation Age of Onset  . Cancer Mother   . Breast cancer Mother 47  . Heart disease Father   . Heart attack Father 69  . Cancer Maternal Aunt   . Breast cancer Maternal Aunt        ? age    SOCIAL HX: former smoker   Current Outpatient Medications:  .  Acetylcysteine (N-ACETYL-L-CYSTEINE PO), Take 600 mcg by mouth 2 (two) times daily., Disp: , Rfl:  .  aspirin 81 MG tablet, Take 81 mg by mouth daily., Disp: , Rfl:  .  Cholecalciferol (VITAMIN D3) 2000 UNITS TABS, Take 1 tablet by  mouth daily., Disp: , Rfl:  .  Coenzyme Q10 (COQ10) 100 MG CAPS, Take 1 capsule by mouth daily., Disp: , Rfl:  .  Emollient (COLLAGEN EX), Apply topically., Disp: , Rfl:  .  fluticasone (FLONASE) 50 MCG/ACT nasal spray, Place 2 sprays into the nose as needed., Disp: , Rfl:  .  MISC NATURAL PRODUCT OP, Take by mouth. Tumeric 562m , Disp: , Rfl:  .  Multiple Vitamin (MULTIVITAMIN)  tablet, Take 1 tablet by mouth daily., Disp: , Rfl:  .  Polyethyl Glycol-Propyl Glycol (SYSTANE) 0.4-0.3 % SOLN, Apply to eye., Disp: , Rfl:  .  simvastatin (ZOCOR) 20 MG tablet, Take 1 tablet (20 mg total) by mouth every evening., Disp: 90 tablet, Rfl: 0 .  venlafaxine XR (EFFEXOR-XR) 150 MG 24 hr capsule, TAKE 1 CAPSULE BY MOUTH EVERY DAY, Disp: 90 capsule, Rfl: 0 .  vitamin C (ASCORBIC ACID) 500 MG tablet, Take 500 mg by mouth 2 (two) times daily., Disp: , Rfl:   EXAM: This was a telehealth telephone visit and thus no physical exam was completed.  ASSESSMENT AND PLAN:  Discussed the following assessment and plan:  Dysphagia She has completed GI evaluation.  She will continue with her dietary changes and monitor her symptoms.  Depression Well-controlled.  Continue venlafaxine.  Hyperlipidemia Continue simvastatin.  She will come in for labs.  Pre-diabetes Check A1c  Osteoporosis Check vitamin D   Orders Placed This Encounter  Procedures  . Comp Met (CMET)    Standing Status:   Future    Standing Expiration Date:   01/31/2021  . Lipid panel    Standing Status:   Future    Standing Expiration Date:   01/31/2021  . HgB A1c    Standing Status:   Future    Standing Expiration Date:   01/31/2021  . CBC    Standing Status:   Future    Standing Expiration Date:   01/31/2021  . TSH    Standing Status:   Future    Standing Expiration Date:   01/31/2021  . Vitamin D (25 hydroxy)    Standing Status:   Future    Standing Expiration Date:   01/31/2021    No orders of the defined types were placed in this encounter.    I discussed the assessment and treatment plan with the patient. The patient was provided an opportunity to ask questions and all were answered. The patient agreed with the plan and demonstrated an understanding of the instructions.   The patient was advised to call back or seek an in-person evaluation if the symptoms worsen or if the condition fails to improve as  anticipated.  I provided 17 minutes of non-face-to-face time during this encounter.   ETommi Rumps MD

## 2020-02-01 NOTE — Assessment & Plan Note (Addendum)
She has completed GI evaluation.  She will continue with her dietary changes and monitor her symptoms.

## 2020-02-01 NOTE — Assessment & Plan Note (Signed)
Check vitamin D. 

## 2020-02-01 NOTE — Assessment & Plan Note (Signed)
Well-controlled.  Continue venlafaxine.

## 2020-02-01 NOTE — Assessment & Plan Note (Signed)
Check A1c. 

## 2020-02-01 NOTE — Assessment & Plan Note (Signed)
Continue simvastatin.  She will come in for labs.

## 2020-02-21 ENCOUNTER — Other Ambulatory Visit (INDEPENDENT_AMBULATORY_CARE_PROVIDER_SITE_OTHER): Payer: Medicare Other

## 2020-02-21 ENCOUNTER — Other Ambulatory Visit: Payer: Self-pay

## 2020-02-21 ENCOUNTER — Other Ambulatory Visit: Payer: Self-pay | Admitting: Family Medicine

## 2020-02-21 DIAGNOSIS — Z130 Encounter for screening for diseases of the blood and blood-forming organs and certain disorders involving the immune mechanism: Secondary | ICD-10-CM

## 2020-02-21 DIAGNOSIS — Z1329 Encounter for screening for other suspected endocrine disorder: Secondary | ICD-10-CM

## 2020-02-21 DIAGNOSIS — M810 Age-related osteoporosis without current pathological fracture: Secondary | ICD-10-CM

## 2020-02-21 DIAGNOSIS — R7303 Prediabetes: Secondary | ICD-10-CM | POA: Diagnosis not present

## 2020-02-21 DIAGNOSIS — E785 Hyperlipidemia, unspecified: Secondary | ICD-10-CM

## 2020-02-21 LAB — LIPID PANEL
Cholesterol: 175 mg/dL (ref 0–200)
HDL: 66.1 mg/dL (ref >39.00)
LDL Cholesterol: 84 mg/dL (ref 0–99)
NonHDL: 108.5
Total CHOL/HDL Ratio: 3
Triglycerides: 121 mg/dL (ref 0.0–149.0)
VLDL: 24.2 mg/dL (ref 0.0–40.0)

## 2020-02-21 LAB — CBC
HCT: 42 % (ref 36.0–46.0)
Hemoglobin: 14.1 g/dL (ref 12.0–15.0)
MCHC: 33.6 g/dL (ref 30.0–36.0)
MCV: 87.6 fl (ref 78.0–100.0)
Platelets: 259 10*3/uL (ref 150.0–400.0)
RBC: 4.79 Mil/uL (ref 3.87–5.11)
RDW: 14.2 % (ref 11.5–15.5)
WBC: 7.4 10*3/uL (ref 4.0–10.5)

## 2020-02-21 LAB — COMPREHENSIVE METABOLIC PANEL
ALT: 20 U/L (ref 0–35)
AST: 24 U/L (ref 0–37)
Albumin: 4.1 g/dL (ref 3.5–5.2)
Alkaline Phosphatase: 67 U/L (ref 39–117)
BUN: 17 mg/dL (ref 6–23)
CO2: 32 mEq/L (ref 19–32)
Calcium: 9.5 mg/dL (ref 8.4–10.5)
Chloride: 103 mEq/L (ref 96–112)
Creatinine, Ser: 0.76 mg/dL (ref 0.40–1.20)
GFR: 73.47 mL/min (ref >60.00)
Glucose, Bld: 108 mg/dL — ABNORMAL HIGH (ref 70–99)
Potassium: 4 mEq/L (ref 3.5–5.1)
Sodium: 140 mEq/L (ref 135–145)
Total Bilirubin: 0.6 mg/dL (ref 0.2–1.2)
Total Protein: 6.8 g/dL (ref 6.0–8.3)

## 2020-02-21 LAB — VITAMIN D 25 HYDROXY (VIT D DEFICIENCY, FRACTURES): VITD: 43.05 ng/mL (ref 30.00–100.00)

## 2020-02-21 LAB — TSH: TSH: 1.47 u[IU]/mL (ref 0.35–4.50)

## 2020-02-21 LAB — HEMOGLOBIN A1C: Hgb A1c MFr Bld: 5.9 % (ref 4.6–6.5)

## 2020-03-24 ENCOUNTER — Other Ambulatory Visit: Payer: Self-pay | Admitting: Family Medicine
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
## 2020-05-27 ENCOUNTER — Other Ambulatory Visit: Payer: Self-pay | Admitting: Family Medicine

## 2020-06-23 ENCOUNTER — Other Ambulatory Visit: Payer: Self-pay | Admitting: Family Medicine

## 2020-08-29 ENCOUNTER — Other Ambulatory Visit: Payer: Self-pay | Admitting: Family Medicine

## 2020-09-18 ENCOUNTER — Other Ambulatory Visit: Payer: Self-pay | Admitting: Family Medicine

## 2020-09-22 ENCOUNTER — Ambulatory Visit: Payer: Medicare Other | Admitting: Family Medicine

## 2020-09-22 ENCOUNTER — Telehealth: Payer: Self-pay

## 2020-09-22 DIAGNOSIS — R7303 Prediabetes: Secondary | ICD-10-CM

## 2020-09-22 DIAGNOSIS — E785 Hyperlipidemia, unspecified: Secondary | ICD-10-CM

## 2020-09-22 NOTE — Telephone Encounter (Signed)
Pt's appt got rescheduled out until mid November. She would like fasting labs to be ordered asap-pt concerned about pre diabetes. Please call pt to schedule labs

## 2020-09-22 NOTE — Telephone Encounter (Signed)
I have ordered the labs that would be appropriate to recheck considering she had a full panel of labs earlier this year.

## 2020-09-22 NOTE — Telephone Encounter (Signed)
The patient rescheduled her appointment to be seen in mid November but she wants labs asap, she has not had labs done since March can I order the labs from then for her and schedule her lab appointment?

## 2020-09-23 NOTE — Telephone Encounter (Signed)
I called and scheduled a lab appointment with the patient.  Stephanie Mora,cma

## 2020-10-10 ENCOUNTER — Other Ambulatory Visit (INDEPENDENT_AMBULATORY_CARE_PROVIDER_SITE_OTHER): Payer: Medicare Other

## 2020-10-10 ENCOUNTER — Other Ambulatory Visit: Payer: Self-pay

## 2020-10-10 DIAGNOSIS — R7303 Prediabetes: Secondary | ICD-10-CM

## 2020-10-10 DIAGNOSIS — E785 Hyperlipidemia, unspecified: Secondary | ICD-10-CM

## 2020-10-10 LAB — HEPATIC FUNCTION PANEL
ALT: 22 U/L (ref 0–35)
AST: 25 U/L (ref 0–37)
Albumin: 4.1 g/dL (ref 3.5–5.2)
Alkaline Phosphatase: 68 U/L (ref 39–117)
Bilirubin, Direct: 0.1 mg/dL (ref 0.0–0.3)
Total Bilirubin: 0.5 mg/dL (ref 0.2–1.2)
Total Protein: 6.8 g/dL (ref 6.0–8.3)

## 2020-10-10 LAB — LDL CHOLESTEROL, DIRECT: Direct LDL: 73 mg/dL

## 2020-10-10 LAB — HEMOGLOBIN A1C: Hgb A1c MFr Bld: 6.1 % (ref 4.6–6.5)

## 2020-10-30 ENCOUNTER — Other Ambulatory Visit: Payer: Self-pay

## 2020-11-03 ENCOUNTER — Ambulatory Visit (INDEPENDENT_AMBULATORY_CARE_PROVIDER_SITE_OTHER): Payer: Medicare Other | Admitting: Family Medicine

## 2020-11-03 ENCOUNTER — Other Ambulatory Visit: Payer: Self-pay

## 2020-11-03 ENCOUNTER — Encounter: Payer: Self-pay | Admitting: Family Medicine

## 2020-11-03 DIAGNOSIS — R7303 Prediabetes: Secondary | ICD-10-CM

## 2020-11-03 DIAGNOSIS — I10 Essential (primary) hypertension: Secondary | ICD-10-CM | POA: Diagnosis not present

## 2020-11-03 LAB — POCT URINALYSIS DIPSTICK
Bilirubin, UA: NEGATIVE
Glucose, UA: NEGATIVE
Ketones, UA: NEGATIVE
Leukocytes, UA: NEGATIVE
Nitrite, UA: NEGATIVE
Protein, UA: NEGATIVE
Spec Grav, UA: 1.02 (ref 1.010–1.025)
Urobilinogen, UA: 0.2 E.U./dL
pH, UA: 5 (ref 5.0–8.0)

## 2020-11-03 MED ORDER — LOSARTAN POTASSIUM 50 MG PO TABS: 50.0000 mg | ORAL_TABLET | Freq: Every day | ORAL | 3 refills | Status: DC

## 2020-11-03 NOTE — Patient Instructions (Signed)
Nice to see you. We will start you on losartan 50 mg once daily for your blood pressure. You will return in 7 to 10 days for labs and nurse BP check. I will see you in 3 months.

## 2020-11-06 ENCOUNTER — Other Ambulatory Visit: Payer: Self-pay | Admitting: Family Medicine

## 2020-11-06 DIAGNOSIS — R8290 Unspecified abnormal findings in urine: Secondary | ICD-10-CM

## 2020-11-06 NOTE — Assessment & Plan Note (Signed)
Encouraged continued diet and exercise. Advised against boost intake as a source of protein.

## 2020-11-06 NOTE — Progress Notes (Signed)
  Tommi Rumps, MD Phone: [phone]  Stephanie Mora is a 79 y.o. female who presents today for follow-up.  Hypertension: Typically running 299-242 systolic. No chest pain, shortness of breath, or edema.  Prediabetes: Most recent A1c 6.1. She has been more conscious of her sugar intake. She is eating healthier. No fried foods or fast foods. She tried upper activity some with yoga. She plans to walk more. She wonders about taking boost as a protein source.  Social History   Tobacco Use  Smoking Status Former Smoker  . Years: 25.00  . Types: Cigarettes  . Quit date: 12/20/1993  . Years since quitting: 26.8  Smokeless Tobacco Never Used     ROS see history of present illness  Objective  Physical Exam Vitals:   11/03/20 1434  BP: (!) 150/80  Pulse: 88  Temp: 98.3 F (36.8 C)  SpO2: 98%    BP Readings from Last 3 Encounters:  11/03/20 (!) 150/80  01/08/20 138/74  07/31/19 125/80   Wt Readings from Last 3 Encounters:  11/03/20 161 lb 6.4 oz (73.2 kg)  02/01/20 163 lb (73.9 kg)  01/08/20 163 lb (73.9 kg)    Physical Exam Constitutional:      General: She is not in acute distress.    Appearance: She is not diaphoretic.  Cardiovascular:     Rate and Rhythm: Normal rate and regular rhythm.     Heart sounds: Normal heart sounds.  Pulmonary:     Effort: Pulmonary effort is normal.     Breath sounds: Normal breath sounds.  Skin:    General: Skin is warm and dry.  Neurological:     Mental Status: She is alert.      Assessment/Plan: Please see individual problem list.  Problem List Items Addressed This Visit    Hypertension    Above goal. We will start the patient on losartan 50 mg once daily. She will return in 7 to 10 days for nurse BP check as well as BMP. Check urinalysis as well.      Relevant Medications   losartan (COZAAR) 50 MG tablet   Other Relevant Orders   Basic Metabolic Panel (BMET)   POCT Urinalysis Dipstick (Completed)   Pre-diabetes     Encouraged continued diet and exercise. Advised against boost intake as a source of protein.           This visit occurred during the SARS-CoV-2 public health emergency.  Safety protocols were in place, including screening questions prior to the visit, additional usage of staff PPE, and extensive cleaning of exam room while observing appropriate contact time as indicated for disinfecting solutions.    Tommi Rumps, MD [place]

## 2020-11-06 NOTE — Assessment & Plan Note (Signed)
Above goal. We will start the patient on losartan 50 mg once daily. She will return in 7 to 10 days for nurse BP check as well as BMP. Check urinalysis as well.

## 2020-11-07 ENCOUNTER — Ambulatory Visit: Payer: Medicare Other

## 2020-11-07 ENCOUNTER — Telehealth: Payer: Self-pay

## 2020-11-07 DIAGNOSIS — R8290 Unspecified abnormal findings in urine: Secondary | ICD-10-CM

## 2020-11-07 NOTE — Telephone Encounter (Signed)
-----   Message from Leone Haven, MD sent at 11/06/2020 11:41 AM EST ----- Please let the patient know there is no protein in her urine though the dipstick did reveal some blood.  We need to confirm this with a urine microscopy.  Sometimes there is blood on the dipstick though it is negative on the microscopy which would indicate that there is no blood in her urine.  I have placed an order.  I would like to have this checked when she comes in for the nurse BP check and lab work next week.

## 2020-11-07 NOTE — Telephone Encounter (Signed)
Patietn was given lab results and informed that at her next lab appointment a U/A would be done as well.  Patient understood results.  Brevon Dewald,cma

## 2020-11-11 ENCOUNTER — Other Ambulatory Visit: Payer: Self-pay

## 2020-11-11 ENCOUNTER — Ambulatory Visit (INDEPENDENT_AMBULATORY_CARE_PROVIDER_SITE_OTHER): Payer: Medicare Other

## 2020-11-11 VITALS — BP 138/84 | HR 80

## 2020-11-11 DIAGNOSIS — I10 Essential (primary) hypertension: Secondary | ICD-10-CM

## 2020-11-11 DIAGNOSIS — R8290 Unspecified abnormal findings in urine: Secondary | ICD-10-CM

## 2020-11-11 LAB — POCT URINALYSIS DIPSTICK OB
Bilirubin, UA: NEGATIVE
Blood, UA: NEGATIVE
Glucose, UA: NEGATIVE
Ketones, UA: NEGATIVE
Leukocytes, UA: NEGATIVE
Nitrite, UA: NEGATIVE
POC,PROTEIN,UA: NEGATIVE
Spec Grav, UA: >=1.03 — AB (ref 1.010–1.025)
Urobilinogen, UA: 0.2 E.U./dL
pH, UA: 5.5 (ref 5.0–8.0)

## 2020-11-11 NOTE — Progress Notes (Addendum)
Patient is here for a BP check due to bp being high at last visit, as per patient.  Currently patients BP is 138/84 and BPM is 80.  Patient has no complaints of headaches, blurry vision, chest pain, arm pain, light headedness, dizziness, and nor jaw pain. Please see previous note for order.   Reviewed.  No changes made.  Please forward information to Dr Caryl Bis.   Dr Nicki Reaper

## 2020-11-11 NOTE — Addendum Note (Signed)
Addended by: Ezequiel Ganser on: 11/11/2020 03:28 PM   Modules accepted: Orders

## 2020-11-12 LAB — URINALYSIS, MICROSCOPIC ONLY

## 2020-11-12 LAB — BASIC METABOLIC PANEL
BUN: 18 mg/dL (ref 6–23)
CO2: 29 mEq/L (ref 19–32)
Calcium: 9.7 mg/dL (ref 8.4–10.5)
Chloride: 103 mEq/L (ref 96–112)
Creatinine, Ser: 0.68 mg/dL (ref 0.40–1.20)
GFR: 82.83 mL/min (ref >60.00)
Glucose, Bld: 83 mg/dL (ref 70–99)
Potassium: 4.3 mEq/L (ref 3.5–5.1)
Sodium: 138 mEq/L (ref 135–145)

## 2020-11-12 NOTE — Progress Notes (Signed)
Agree. BP is acceptable for her age. We can monitor on the losartan at follow-up.

## 2020-11-18 ENCOUNTER — Ambulatory Visit (INDEPENDENT_AMBULATORY_CARE_PROVIDER_SITE_OTHER): Payer: Medicare Other

## 2020-11-18 VITALS — Ht 62.0 in | Wt 161.0 lb

## 2020-11-18 DIAGNOSIS — Z0000 Encounter for general adult medical examination without abnormal findings: Secondary | ICD-10-CM | POA: Diagnosis not present

## 2020-11-18 NOTE — Progress Notes (Signed)
Subjective:   DELORUS LANGWELL is a 79 y.o. female who presents for Medicare Annual (Subsequent) preventive examination.  Review of Systems    No ROS.  Medicare Wellness Virtual Visit.          Objective:    Today's Vitals   11/18/20 1345  Weight: 161 lb (73 kg)  Height: 5\' 2"  (1.575 m)   Body mass index is 29.45 kg/m.  Advanced Directives 11/18/2020 01/08/2020 01/08/2020 11/07/2019 06/13/2019 05/23/2019  Does Patient Have a Medical Advance Directive? Yes No Yes Yes Yes Yes  Type of Paramedic of [place];Living will - - [place];Living will [place];Living will Living will;Healthcare Power of Attorney  Does patient want to make changes to medical advance directive? No - Patient declined No - Patient declined - No - Patient declined No - Patient declined No - Patient declined  Copy of Power in Chart? No - copy requested - - No - copy requested No - copy requested No - copy requested  Would patient like information on creating a medical advance directive? - No - Patient declined - - - -    Current Medications (verified) Outpatient Encounter Medications as of 11/18/2020  Medication Sig  . Acetylcysteine (N-ACETYL-L-CYSTEINE PO) Take 600 mcg by mouth 2 (two) times daily.  Marland Kitchen aspirin 81 MG tablet Take 81 mg by mouth daily.  . Cholecalciferol (VITAMIN D3) 2000 UNITS TABS Take 1 tablet by mouth daily.  . Coenzyme Q10 (COQ10) 100 MG CAPS Take 1 capsule by mouth daily.  . Emollient (COLLAGEN EX) Apply topically.  . fluticasone (FLONASE) 50 MCG/ACT nasal spray Place 2 sprays into the nose as needed.  Marland Kitchen losartan (COZAAR) 50 MG tablet Take 1 tablet (50 mg total) by mouth daily.  Marland Kitchen MISC NATURAL PRODUCT OP Take by mouth. Tumeric 500mg    . Multiple Vitamin (MULTIVITAMIN) tablet Take 1 tablet by mouth daily.  Vladimir Faster Glycol-Propyl Glycol (SYSTANE) 0.4-0.3 % SOLN Apply to eye.  . simvastatin (ZOCOR) 20 MG  tablet TAKE 1 TABLET(20 MG) BY MOUTH EVERY EVENING  . venlafaxine XR (EFFEXOR-XR) 150 MG 24 hr capsule TAKE 1 CAPSULE BY MOUTH EVERY DAY  . vitamin C (ASCORBIC ACID) 500 MG tablet Take 500 mg by mouth 2 (two) times daily.   No facility-administered encounter medications on file as of 11/18/2020.    Allergies (verified) Patient has no known allergies.   History: Past Medical History:  Diagnosis Date  . Bilateral dry eyes   . Breast cancer ([place]) 2000   breast right, lumpectomy and XRT at [facility].ER/PR positive INVASIVE WELL DIFFERENTIATED DUCTAL ADENOCARCINOMA, TUBULAR TYPE.  Marland Kitchen Heart murmur    as child  . Hyperlipidemia   . Osteoporosis    reclast treatment in past  . Personal history of radiation therapy 2000   F/U right breast cancer   Past Surgical History:  Procedure Laterality Date  . BREAST BIOPSY Right 2000   +  . BREAST BIOPSY Left 1980's   neg, Dr Bary Castilla  . BREAST LUMPECTOMY Right 2000   at [facility]  . CATARACT EXTRACTION W/PHACO Right 05/23/2019   Procedure: CATARACT EXTRACTION PHACO AND INTRAOCULAR LENS PLACEMENT ([place])  RIGHT TORIC LENS;  Surgeon: Leandrew Koyanagi, MD;  Location: [facility];  Service: Ophthalmology;  Laterality: Right;  . CATARACT EXTRACTION W/PHACO Left 06/13/2019   Procedure: CATARACT EXTRACTION PHACO AND INTRAOCULAR LENS PLACEMENT ([place]) LEFT TORIC LENS;  Surgeon: Leandrew Koyanagi, MD;  Location: [facility];  Service: Ophthalmology;  Laterality: Left;   Family History  Problem Relation Age of Onset  . Cancer Mother   . Breast cancer Mother 86  . Heart disease Father   . Heart attack Father 21  . Cancer Maternal Aunt   . Breast cancer Maternal Aunt        ? age   Social History   Socioeconomic History  . Marital status: Married    Spouse name: Not on file  . Number of children: Not on file  . Years of education: Not on file  . Highest education level: Not on file  Occupational History  . Not on file  Tobacco Use  .  Smoking status: Former Smoker    Years: 25.00    Types: Cigarettes    Quit date: 12/20/1993    Years since quitting: 26.9  . Smokeless tobacco: Never Used  Vaping Use  . Vaping Use: Never used  Substance and Sexual Activity  . Alcohol use: Yes    Comment: occasional  . Drug use: No  . Sexual activity: Not on file  Other Topics Concern  . Not on file  Social History Narrative   Lives in [place] with husband.      Diet - healthy diet   Exercise - [employer] weight training   Social Determinants of Health   Financial Resource Strain: Low Risk   . Difficulty of Paying Living Expenses: Not hard at all  Food Insecurity: No Food Insecurity  . Worried About Charity fundraiser in the Last Year: Never true  . Ran Out of Food in the Last Year: Never true  Transportation Needs: No Transportation Needs  . Lack of Transportation (Medical): No  . Lack of Transportation (Non-Medical): No  Physical Activity: Insufficiently Active  . Days of Exercise per Week: 2 days  . Minutes of Exercise per Session: 60 min  Stress: No Stress Concern Present  . Feeling of Stress : Not at all  Social Connections: Unknown  . Frequency of Communication with Friends and Family: Not on file  . Frequency of Social Gatherings with Friends and Family: Not on file  . Attends Religious Services: Not on file  . Active Member of Clubs or Organizations: Not on file  . Attends Archivist Meetings: Not on file  . Marital Status: Married    Tobacco Counseling Counseling given: Not Answered   Clinical Intake:  Pre-visit preparation completed: Yes        Diabetes: No  How often do you need to have someone help you when you read instructions, pamphlets, or other written materials from your doctor or pharmacy?: 1 - Never  Interpreter Needed?: No      Activities of Daily Living No flowsheet data found.  Patient Care Team: Leone Haven, MD as PCP - General (Family  Medicine)  Indicate any recent Medical Services you may have received from other than [facility] providers in the past year (date may be approximate).     Assessment:   This is a routine wellness examination for [place].  I connected with Cliffie today by telephone and verified that I am speaking with the correct person using two identifiers. Location patient: home Location provider: work Persons participating in the virtual visit: patient, Marine scientist.    I discussed the limitations, risks, security and privacy concerns of performing an evaluation and management service by telephone and the availability of in person appointments. The patient expressed understanding and verbally consented to  this telephonic visit.    Interactive audio and video telecommunications were attempted between this provider and patient, however failed, due to patient having technical difficulties OR patient did not have access to video capability.  We continued and completed visit with audio only.  Some vital signs may be absent or patient reported.   Hearing/Vision screen  Hearing Screening   125Hz  250Hz  500Hz  1000Hz  2000Hz  3000Hz  4000Hz  6000Hz  8000Hz   Right ear:           Left ear:           Comments: Patient is able to hear conversational tones without difficulty. No issues reported.   Vision Screening Comments: Wears corrective lenses  Cataract extraction, bilateral  Visual acuity not assessed, virtual visit. They have seen their ophthalmologist in the last 12 months.  Dietary issues and exercise activities discussed:    Regular diet Fair water intake  Goals      Patient Stated   .  I would like to drink more water (pt-stated)      Depression Screen PHQ 2/9 Scores 11/18/2020 11/03/2020 02/01/2020 11/07/2019 07/31/2019 06/27/2017 06/23/2016  PHQ - 2 Score 0 0 0 0 0 0 0    Fall Risk Fall Risk  11/03/2020 02/01/2020 11/07/2019 07/31/2019 07/28/2018  Falls in the past year? 0 0 0 0 No  Number falls in past yr: 0 0  0 0 -  Follow up Falls evaluation completed Falls evaluation completed Falls prevention discussed;Education provided - -   Handrails in use when climbing stairs? Yes Home free of loose throw rugs in walkways, pet beds, electrical cords, etc? Yes  Adequate lighting in your home to reduce risk of falls? Yes   ASSISTIVE DEVICES UTILIZED TO PREVENT FALLS: Use of a cane, walker or w/c? No   TIMED UP AND GO: Was the test performed? No . Virtual visit.   Cognitive Function:  Patient is alert and oriented x3.  Denies difficulty focusing, making decisions, memory loss.  Enjoys reading, sewing, knitting for brain health.  MMSE/6CIT deferred. Normal by direct communication/observation.    6CIT Screen 11/07/2019  What Year? 0 points  What month? 0 points  What time? 0 points  Count back from 20 0 points  Months in reverse 0 points  Repeat phrase 0 points  Total Score 0    Immunizations Immunization History  Administered Date(s) Administered  . Fluad Quad(high Dose 65+) 10/04/2019  . Influenza, High Dose Seasonal PF 10/11/2014, 11/01/2015, 10/27/2016, 10/15/2017, 10/26/2018  . Influenza-Unspecified 10/26/2012, 10/29/2013, 10/26/2014, 10/28/2015, 10/15/2020  . PFIZER SARS-COV-2 Vaccination 01/11/2020, 02/01/2020, 10/01/2020  . Pneumococcal Conjugate-13 05/23/2014  . Pneumococcal Polysaccharide-23 07/06/2011  . Tdap 04/06/2011  . Zoster 07/06/2011   Health Maintenance Health Maintenance  Topic Date Due  . Hepatitis C Screening  11/18/2021 (Originally 1941/05/28)  . TETANUS/TDAP  04/05/2021  . INFLUENZA VACCINE  Completed  . DEXA SCAN  Completed  . COVID-19 Vaccine  Completed  . PNA vac Low Risk Adult  Completed   Colorectal cancer screening: Completed 08/06/14. Repeat every 5 yearsPlans to address with pcp at next visit.   Mammogram status: Completed 08/14/19. Repeat every year    Bone Density- 09/22/18. Osteoporosis. Repeat every 2 years. Plans to follow up with physician. Taking  Vitamin D3 2000 [place].   Lung Cancer Screening: (Low Dose CT Chest recommended if Age 31-80 years, 30 pack-year currently smoking OR have quit w/in 15years.) does not qualify.   Hepatitis C Screening: does not qualify.   Vision Screening: Recommended  annual ophthalmology exams for early detection of glaucoma and other disorders of the eye. Is the patient up to date with their annual eye exam?  Yes  Who is the provider or what is the name of the office in which the patient attends annual eye exams? [place] Screening: Recommended annual dental exams for proper oral hygiene  Community Resource Referral / Chronic Care Management: CRR required this visit?  No   CCM required this visit?  No      Plan:   Keep all routine maintenance appointments.   Cpe 02/11/21 @ 9:00  I have personally reviewed and noted the following in the patient's chart:   . Medical and social history . Use of alcohol, tobacco or illicit drugs  . Current medications and supplements . Functional ability and status . Nutritional status . Physical activity . Advanced directives . List of other physicians . Hospitalizations, surgeries, and ER visits in previous 12 months . Vitals . Screenings to include cognitive, depression, and falls . Referrals and appointments  In addition, I have reviewed and discussed with patient certain preventive protocols, quality metrics, and best practice recommendations. A written personalized care plan for preventive services as well as general preventive health recommendations were provided to patient via mychart.     Varney Biles, LPN   [phone]

## 2020-11-18 NOTE — Patient Instructions (Addendum)
Stephanie Mora , Thank you for taking time to come for your Medicare Wellness Visit. I appreciate your ongoing commitment to your health goals. Please review the following plan we discussed and let me know if I can assist you in the future.   These are the goals we discussed: Goals      Patient Stated   .  I would like to drink more water (pt-stated)       This is a list of the screening recommended for you and due dates:  Health Maintenance  Topic Date Due  .  Hepatitis C: One time screening is recommended by Center for Disease Control  (CDC) for  adults born from [birth year] through 1965.   11/18/2021*  . Tetanus Vaccine  04/05/2021  . Flu Shot  Completed  . DEXA scan (bone density measurement)  Completed  . COVID-19 Vaccine  Completed  . Pneumonia vaccines  Completed  *Topic was postponed. The date shown is not the original due date.    Immunizations Immunization History  Administered Date(s) Administered  . Fluad Quad(high Dose 65+) 10/04/2019  . Influenza, High Dose Seasonal PF 10/11/2014, 11/01/2015, 10/27/2016, 10/15/2017, 10/26/2018  . Influenza-Unspecified 10/26/2012, 10/29/2013, 10/26/2014, 10/28/2015, 10/15/2020  . PFIZER SARS-COV-2 Vaccination 01/11/2020, 02/01/2020, 10/01/2020  . Pneumococcal Conjugate-13 05/23/2014  . Pneumococcal Polysaccharide-23 07/06/2011  . Tdap 04/06/2011  . Zoster 07/06/2011   Keep all routine maintenance appointments.   Cpe 02/11/21 @ 9:00  Advanced directives: End of life planning; Advance aging; Advanced directives discussed.  Copy of current HCPOA/Living Will requested.    Conditions/risks identified: none new.   Follow up in one year for your annual wellness visit.   Preventive Care 18 Years and Older, Female Preventive care refers to lifestyle choices and visits with your health care provider that can promote health and wellness. What does preventive care include?  A yearly physical exam. This is also called an annual well  check.  Dental exams once or twice a year.  Routine eye exams. Ask your health care provider how often you should have your eyes checked.  Personal lifestyle choices, including:  Daily care of your teeth and gums.  Regular physical activity.  Eating a healthy diet.  Avoiding tobacco and drug use.  Limiting alcohol use.  Practicing safe sex.  Taking low-dose aspirin every day.  Taking vitamin and mineral supplements as recommended by your health care provider. What happens during an annual well check? The services and screenings done by your health care provider during your annual well check will depend on your age, overall health, lifestyle risk factors, and family history of disease. Counseling  Your health care provider may ask you questions about your:  Alcohol use.  Tobacco use.  Drug use.  Emotional well-being.  Home and relationship well-being.  Sexual activity.  Eating habits.  History of falls.  Memory and ability to understand (cognition).  Work and work Statistician.  Reproductive health. Screening  You may have the following tests or measurements:  Height, weight, and BMI.  Blood pressure.  Lipid and cholesterol levels. These may be checked every 5 years, or more frequently if you are over 59 years old.  Skin check.  Lung cancer screening. You may have this screening every year starting at age 24 if you have a 30-pack-year history of smoking and currently smoke or have quit within the past 15 years.  Fecal occult blood test (FOBT) of the stool. You may have this test every year starting at age 59.  Flexible sigmoidoscopy or colonoscopy. You may have a sigmoidoscopy every 5 years or a colonoscopy every 10 years starting at age 30.  Hepatitis C blood test.  Hepatitis B blood test.  Sexually transmitted disease (STD) testing.  Diabetes screening. This is done by checking your blood sugar (glucose) after you have not eaten for a while  (fasting). You may have this done every 1-3 years.  Bone density scan. This is done to screen for osteoporosis. You may have this done starting at age 28.  Mammogram. This may be done every 1-2 years. Talk to your health care provider about how often you should have regular mammograms. Talk with your health care provider about your test results, treatment options, and if necessary, the need for more tests. Vaccines  Your health care provider may recommend certain vaccines, such as:  Influenza vaccine. This is recommended every year.  Tetanus, diphtheria, and acellular pertussis (Tdap, Td) vaccine. You may need a Td booster every 10 years.  Zoster vaccine. You may need this after age 72.  Pneumococcal 13-valent conjugate (PCV13) vaccine. One dose is recommended after age 66.  Pneumococcal polysaccharide (PPSV23) vaccine. One dose is recommended after age 30. Talk to your health care provider about which screenings and vaccines you need and how often you need them. This information is not intended to replace advice given to you by your health care provider. Make sure you discuss any questions you have with your health care provider. Document Released: 01/02/2016 Document Revised: 08/25/2016 Document Reviewed: 10/07/2015 Elsevier Interactive Patient Education  2017 [place] Prevention in the Home Falls can cause injuries. They can happen to people of all ages. There are many things you can do to make your home safe and to help prevent falls. What can I do on the outside of my home?  Regularly fix the edges of walkways and driveways and fix any cracks.  Remove anything that might make you trip as you walk through a door, such as a raised step or threshold.  Trim any bushes or trees on the path to your home.  Use bright outdoor lighting.  Clear any walking paths of anything that might make someone trip, such as rocks or tools.  Regularly check to see if handrails are loose  or broken. Make sure that both sides of any steps have handrails.  Any raised decks and porches should have guardrails on the edges.  Have any leaves, snow, or ice cleared regularly.  Use sand or salt on walking paths during winter.  Clean up any spills in your garage right away. This includes oil or grease spills. What can I do in the bathroom?  Use night lights.  Install grab bars by the toilet and in the tub and shower. Do not use towel bars as grab bars.  Use non-skid mats or decals in the tub or shower.  If you need to sit down in the shower, use a plastic, non-slip stool.  Keep the floor dry. Clean up any water that spills on the floor as soon as it happens.  Remove soap buildup in the tub or shower regularly.  Attach bath mats securely with double-sided non-slip rug tape.  Do not have throw rugs and other things on the floor that can make you trip. What can I do in the bedroom?  Use night lights.  Make sure that you have a light by your bed that is easy to reach.  Do not use any sheets or blankets  that are too big for your bed. They should not hang down onto the floor.  Have a firm chair that has side arms. You can use this for support while you get dressed.  Do not have throw rugs and other things on the floor that can make you trip. What can I do in the kitchen?  Clean up any spills right away.  Avoid walking on wet floors.  Keep items that you use a lot in easy-to-reach places.  If you need to reach something above you, use a strong step stool that has a grab bar.  Keep electrical cords out of the way.  Do not use floor polish or wax that makes floors slippery. If you must use wax, use non-skid floor wax.  Do not have throw rugs and other things on the floor that can make you trip. What can I do with my stairs?  Do not leave any items on the stairs.  Make sure that there are handrails on both sides of the stairs and use them. Fix handrails that are  broken or loose. Make sure that handrails are as long as the stairways.  Check any carpeting to make sure that it is firmly attached to the stairs. Fix any carpet that is loose or worn.  Avoid having throw rugs at the top or bottom of the stairs. If you do have throw rugs, attach them to the floor with carpet tape.  Make sure that you have a light switch at the top of the stairs and the bottom of the stairs. If you do not have them, ask someone to add them for you. What else can I do to help prevent falls?  Wear shoes that:  Do not have high heels.  Have rubber bottoms.  Are comfortable and fit you well.  Are closed at the toe. Do not wear sandals.  If you use a stepladder:  Make sure that it is fully opened. Do not climb a closed stepladder.  Make sure that both sides of the stepladder are locked into place.  Ask someone to hold it for you, if possible.  Clearly mark and make sure that you can see:  Any grab bars or handrails.  First and last steps.  Where the edge of each step is.  Use tools that help you move around (mobility aids) if they are needed. These include:  Canes.  Walkers.  Scooters.  Crutches.  Turn on the lights when you go into a dark area. Replace any light bulbs as soon as they burn out.  Set up your furniture so you have a clear path. Avoid moving your furniture around.  If any of your floors are uneven, fix them.  If there are any pets around you, be aware of where they are.  Review your medicines with your doctor. Some medicines can make you feel dizzy. This can increase your chance of falling. Ask your doctor what other things that you can do to help prevent falls. This information is not intended to replace advice given to you by your health care provider. Make sure you discuss any questions you have with your health care provider. Document Released: 10/02/2009 Document Revised: 05/13/2016 Document Reviewed: 01/10/2015 Elsevier  Interactive Patient Education  2017 [employer].

## 2020-12-02 ENCOUNTER — Other Ambulatory Visit: Payer: Self-pay | Admitting: Family Medicine

## 2020-12-22 ENCOUNTER — Other Ambulatory Visit: Payer: Self-pay | Admitting: Family Medicine

## 2021-02-11 ENCOUNTER — Ambulatory Visit (INDEPENDENT_AMBULATORY_CARE_PROVIDER_SITE_OTHER): Payer: Medicare Other | Admitting: Family Medicine

## 2021-02-11 ENCOUNTER — Other Ambulatory Visit: Payer: Self-pay

## 2021-02-11 ENCOUNTER — Encounter: Payer: Self-pay | Admitting: Family Medicine

## 2021-02-11 VITALS — BP 140/80 | HR 80 | Temp 98.1°F | Ht 62.0 in | Wt 164.4 lb

## 2021-02-11 DIAGNOSIS — Z1159 Encounter for screening for other viral diseases: Secondary | ICD-10-CM

## 2021-02-11 DIAGNOSIS — R7303 Prediabetes: Secondary | ICD-10-CM | POA: Diagnosis not present

## 2021-02-11 DIAGNOSIS — Z0000 Encounter for general adult medical examination without abnormal findings: Secondary | ICD-10-CM

## 2021-02-11 DIAGNOSIS — I10 Essential (primary) hypertension: Secondary | ICD-10-CM | POA: Diagnosis not present

## 2021-02-11 DIAGNOSIS — Z1231 Encounter for screening mammogram for malignant neoplasm of breast: Secondary | ICD-10-CM

## 2021-02-11 DIAGNOSIS — Z1211 Encounter for screening for malignant neoplasm of colon: Secondary | ICD-10-CM

## 2021-02-11 DIAGNOSIS — M810 Age-related osteoporosis without current pathological fracture: Secondary | ICD-10-CM

## 2021-02-11 DIAGNOSIS — E785 Hyperlipidemia, unspecified: Secondary | ICD-10-CM

## 2021-02-11 DIAGNOSIS — Z0001 Encounter for general adult medical examination with abnormal findings: Secondary | ICD-10-CM

## 2021-02-11 MED ORDER — LOSARTAN POTASSIUM 100 MG PO TABS: 100.0000 mg | ORAL_TABLET | Freq: Every day | ORAL | 1 refills | Status: DC

## 2021-02-11 NOTE — Assessment & Plan Note (Signed)
Exam completed.  Encouraged increasing exercise and continued healthy diet.  Discussed increasing vegetables and fruits.  Mammogram ordered.  Patient will call to schedule.  She will also schedule a DEXA scan.  Referral to GI to discuss potential colonoscopy placed.  We will try to track down her records on the Shingrix vaccine.  Hepatitis C to be checked today.  Lab work as outlined below.

## 2021-02-11 NOTE — Assessment & Plan Note (Addendum)
Still above goal.  We will increase losartan to 100 mg once daily.  She will return in 7 to 10 days for labs and nurse BP check.  Follow-up in 3 months with me.  Patient does have slight lightheadedness at times that I suspect is related to inadequate hydration.  She will increase her hydration.  Discussed monitoring for lightheadedness with this and if it increases or becomes worse she will let us know.

## 2021-02-11 NOTE — Progress Notes (Addendum)
Tommi Rumps, MD Phone: [phone]  Stephanie Mora is a 80 y.o. female who presents today for CPE.  Diet: tries to eat healthy, has a sweet tooth, does eat some broccoli and fruit Exercise: Silver sneakers 1-2 times a week Pap smear: Aged out Colonoscopy: Due Mammogram: Due Family history-  Colon cancer: No  Breast cancer: Yes in several family members, and personal history  Ovarian cancer: No Menses: Postmenopausal Vaccines-   Flu: Up-to-date  Tetanus: Up-to-date  Shingles: Reports having had last year  COVID19: Up-to-date  Pneumonia: Up-to-date HIV screening: Aged out Hep C Screening: Due Tobacco use: No Alcohol use: Occasional Illicit Drug use: No Dentist: Yes Ophthalmology: Yes  Hypertension: Continues to have issues with her blood pressure running in the 140s over 70s and 80s.  No chest pain or shortness of breath.  Occasional lightheadedness over the last several months typically when rising though it would resolve quickly.  She does note she does not drink enough fluids.  Active Ambulatory Problems    Diagnosis Date Noted  . Depression 07/05/2012  . History of right breast cancer 07/05/2012  . Medicare annual wellness visit, subsequent 05/09/2013  . Osteoporosis 05/23/2014  . Hyperlipidemia 05/23/2014  . Obesity (BMI 30-39.9) 05/23/2014  . Abnormal EKG 05/31/2014  . Trigger finger, acquired 12/05/2015  . Encounter for general adult medical examination with abnormal findings 12/27/2016  . Tremor 12/27/2016  . GERD (gastroesophageal reflux disease) 12/27/2016  . Pre-diabetes 06/27/2017  . Skin nodule 06/27/2017  . Stress incontinence 12/31/2017  . Hearing difficulty 12/31/2017  . Changes in vision 12/31/2017  . Rash 07/28/2018  . Dysphagia 01/29/2019  . Orthostatic lightheadedness 08/02/2019  . Hypertension 11/03/2020   Resolved Ambulatory Problems    Diagnosis Date Noted  . Hyperlipidemia LDL goal < 100 07/05/2012  . Osteoporosis 07/05/2012  .  Splitting of nail 05/09/2013  . Belching 05/09/2013  . Hematuria 05/09/2013  . Trigger finger, acquired 05/09/2013  . Abdominal pain, left lower quadrant 11/12/2013  . Right shoulder pain 05/23/2014  . Subacromial bursitis 06/06/2014  . HLD (hyperlipidemia) 07/05/2012  . Cerumen impaction 11/25/2014  . Dysuria 11/25/2014  . Elevated BP 12/05/2015  . Dermatitis 12/05/2015  . Cerumen impaction 06/27/2017   Past Medical History:  Diagnosis Date  . Bilateral dry eyes   . Breast cancer ([place]) 2000  . Heart murmur   . Personal history of radiation therapy 2000    Family History  Problem Relation Age of Onset  . Cancer Mother   . Breast cancer Mother 20  . Heart disease Father   . Heart attack Father 50  . Cancer Maternal Aunt   . Breast cancer Maternal Aunt        ? age    Social History   Socioeconomic History  . Marital status: Married    Spouse name: Not on file  . Number of children: Not on file  . Years of education: Not on file  . Highest education level: Not on file  Occupational History  . Not on file  Tobacco Use  . Smoking status: Former Smoker    Years: 25.00    Types: Cigarettes    Quit date: 12/20/1993    Years since quitting: 27.1  . Smokeless tobacco: Never Used  Vaping Use  . Vaping Use: Never used  Substance and Sexual Activity  . Alcohol use: Yes    Comment: occasional  . Drug use: No  . Sexual activity: Not on file  Other Topics Concern  .  Not on file  Social History Narrative   Lives in [place] with husband.      Diet - healthy diet   Exercise - [employer] weight training   Social Determinants of Health   Financial Resource Strain: Low Risk   . Difficulty of Paying Living Expenses: Not hard at all  Food Insecurity: No Food Insecurity  . Worried About Charity fundraiser in the Last Year: Never true  . Ran Out of Food in the Last Year: Never true  Transportation Needs: No Transportation Needs  . Lack of Transportation (Medical): No   . Lack of Transportation (Non-Medical): No  Physical Activity: Insufficiently Active  . Days of Exercise per Week: 2 days  . Minutes of Exercise per Session: 60 min  Stress: No Stress Concern Present  . Feeling of Stress : Not at all  Social Connections: Unknown  . Frequency of Communication with Friends and Family: Not on file  . Frequency of Social Gatherings with Friends and Family: Not on file  . Attends Religious Services: Not on file  . Active Member of Clubs or Organizations: Not on file  . Attends Archivist Meetings: Not on file  . Marital Status: Married  Human resources officer Violence: Not At Risk  . Fear of Current or Ex-Partner: No  . Emotionally Abused: No  . Physically Abused: No  . Sexually Abused: No    ROS  General:  Negative for nexplained weight loss, fever Skin: Negative for new or changing mole, sore that won't heal HEENT: Positive for trouble hearing, negative for trouble seeing, ringing in ears, mouth sores, hoarseness, change in voice, dysphagia. CV: Positive for lightheadedness, negative for chest pain, dyspnea, edema, palpitations Resp: Negative for cough, dyspnea, hemoptysis GI: Negative for nausea, vomiting, diarrhea, constipation, abdominal pain, melena, hematochezia. GU: Positive for incontinence, negative for dysuria, urinary hesitance, hematuria, vaginal or penile discharge, polyuria, sexual difficulty, lumps in testicle or breasts MSK: Negative for muscle cramps or aches, joint pain or swelling Neuro: Negative for headaches, weakness, numbness, dizziness, passing out/fainting Psych: Positive for memory difficulty that is chronic and stable, negative for depression, anxiety  Objective  Physical Exam Vitals:   02/11/21 0905  BP: 140/80  Pulse: 80  Temp: 98.1 F (36.7 C)  SpO2: 97%   Laying blood pressure 143/81 pulse 77 Sitting blood pressure 137/84 pulse 69 Standing blood pressure 130/84 pulse 76  BP Readings from Last 3  Encounters:  02/11/21 140/80  11/11/20 138/84  11/03/20 (!) 150/80   Wt Readings from Last 3 Encounters:  02/11/21 164 lb 6.4 oz (74.6 kg)  11/18/20 161 lb (73 kg)  11/03/20 161 lb 6.4 oz (73.2 kg)    Physical Exam Constitutional:      General: She is not in acute distress.    Appearance: She is not diaphoretic.  HENT:     Head: Normocephalic and atraumatic.  Eyes:     Conjunctiva/sclera: Conjunctivae normal.     Pupils: Pupils are equal, round, and reactive to light.  Cardiovascular:     Rate and Rhythm: Normal rate and regular rhythm.     Heart sounds: Normal heart sounds.  Pulmonary:     Effort: Pulmonary effort is normal.     Breath sounds: Normal breath sounds.  Abdominal:     General: Bowel sounds are normal. There is no distension.     Palpations: Abdomen is soft.     Tenderness: There is no abdominal tenderness. There is no guarding or rebound.  Genitourinary:    Comments: Fulton Mole, CMA served as chaperone, left breast with a scar in the upper outer quadrant that the patient reports has been evaluated by dermatology, also has a scar around the 9:00 location on the right breast from a prior lumpectomy with a stable pea-sized nodule at the scar that has been previously evaluated, otherwise no skin changes, nipple inversion, masses, or tenderness, no axillary masses bilaterally Musculoskeletal:        General: No edema.     Right lower leg: No edema.     Left lower leg: No edema.  Lymphadenopathy:     Cervical: No cervical adenopathy.  Skin:    General: Skin is warm and dry.  Neurological:     Mental Status: She is alert.  Psychiatric:        Mood and Affect: Mood normal.      Assessment/Plan:   Problem List Items Addressed This Visit    Encounter for general adult medical examination with abnormal findings - Primary    Exam completed.  Encouraged increasing exercise and continued healthy diet.  Discussed increasing vegetables and fruits.  Mammogram  ordered.  Patient will call to schedule.  She will also schedule a DEXA scan.  Referral to GI to discuss potential colonoscopy placed.  We will try to track down her records on the Shingrix vaccine.  Hepatitis C to be checked today.  Lab work as outlined below.      Hyperlipidemia (Chronic)   Relevant Medications   losartan (COZAAR) 100 MG tablet   Other Relevant Orders   Lipid panel   Comp Met (CMET)   Hypertension    Still above goal.  We will increase losartan to 100 mg once daily.  She will return in 7 to 10 days for labs and nurse BP check.  Follow-up in 3 months with me.  Patient does have slight lightheadedness at times that I suspect is related to inadequate hydration.  She will increase her hydration.  Discussed monitoring for lightheadedness with this and if it increases or becomes worse she will let us know.      Relevant Medications   losartan (COZAAR) 100 MG tablet   Osteoporosis (Chronic)   Relevant Orders   DG Bone Density   Pre-diabetes   Relevant Orders   Hemoglobin A1c    Other Visit Diagnoses    Colon cancer screening       Relevant Orders   Ambulatory referral to Gastroenterology   Encounter for hepatitis C screening test for low risk patient       Relevant Orders   Hepatitis C Antibody   Encounter for screening mammogram for malignant neoplasm of breast       Relevant Orders   MM 3D SCREEN BREAST BILATERAL      This visit occurred during the SARS-CoV-2 public health emergency.  Safety protocols were in place, including screening questions prior to the visit, additional usage of staff PPE, and extensive cleaning of exam room while observing appropriate contact time as indicated for disinfecting solutions.    Tommi Rumps, MD [facility]

## 2021-02-11 NOTE — Patient Instructions (Signed)
Nice to see you. Please try to increase your exercise. We will increase your losartan to 100 mg once daily.  Please monitor for any lightheadedness with this increase and if this occurs please let us know. We will have you back for labs in about a week. Please call to schedule your mammogram and DEXA scan at [phone].

## 2021-02-20 ENCOUNTER — Other Ambulatory Visit: Payer: Self-pay

## 2021-02-20 ENCOUNTER — Telehealth: Payer: Self-pay | Admitting: Internal Medicine

## 2021-02-20 ENCOUNTER — Ambulatory Visit (INDEPENDENT_AMBULATORY_CARE_PROVIDER_SITE_OTHER): Payer: Medicare Other

## 2021-02-20 VITALS — BP 147/79 | HR 74

## 2021-02-20 DIAGNOSIS — Z1159 Encounter for screening for other viral diseases: Secondary | ICD-10-CM

## 2021-02-20 DIAGNOSIS — E785 Hyperlipidemia, unspecified: Secondary | ICD-10-CM

## 2021-02-20 DIAGNOSIS — I10 Essential (primary) hypertension: Secondary | ICD-10-CM

## 2021-02-20 DIAGNOSIS — R7303 Prediabetes: Secondary | ICD-10-CM

## 2021-02-20 LAB — COMPREHENSIVE METABOLIC PANEL
ALT: 22 U/L (ref 0–35)
AST: 25 U/L (ref 0–37)
Albumin: 3.9 g/dL (ref 3.5–5.2)
Alkaline Phosphatase: 62 U/L (ref 39–117)
BUN: 19 mg/dL (ref 6–23)
CO2: 28 mEq/L (ref 19–32)
Calcium: 9.2 mg/dL (ref 8.4–10.5)
Chloride: 105 mEq/L (ref 96–112)
Creatinine, Ser: 0.66 mg/dL (ref 0.40–1.20)
GFR: 83.26 mL/min (ref >60.00)
Glucose, Bld: 101 mg/dL — ABNORMAL HIGH (ref 70–99)
Potassium: 4.1 mEq/L (ref 3.5–5.1)
Sodium: 140 mEq/L (ref 135–145)
Total Bilirubin: 0.6 mg/dL (ref 0.2–1.2)
Total Protein: 6.3 g/dL (ref 6.0–8.3)

## 2021-02-20 LAB — HEPATITIS C ANTIBODY
Hepatitis C Ab: NONREACTIVE
SIGNAL TO CUT-OFF: 0 (ref <1.00)

## 2021-02-20 LAB — LIPID PANEL
Cholesterol: 157 mg/dL (ref 0–200)
HDL: 67 mg/dL (ref >39.00)
LDL Cholesterol: 71 mg/dL (ref 0–99)
NonHDL: 89.53
Total CHOL/HDL Ratio: 2
Triglycerides: 94 mg/dL (ref 0.0–149.0)
VLDL: 18.8 mg/dL (ref 0.0–40.0)

## 2021-02-20 LAB — HEMOGLOBIN A1C: Hgb A1c MFr Bld: 6 % (ref 4.6–6.5)

## 2021-02-20 NOTE — Progress Notes (Signed)
Patient is here for a BP check due to bp being high at last visit, as per patient.  Currently patients BP is 147/79 and BPM is 74.  Patient has no complaints of headaches, blurry vision, chest pain, arm pain, light headedness, dizziness, and nor jaw pain. Please see previous note for order.

## 2021-02-20 NOTE — Telephone Encounter (Signed)
Please see the note on Stephanie Mora regarding her blood pressure.  Still above goal.  Please forward information to Dr Caryl Bis.  Spot check pressure and needs f/u with Dr Caryl Bis.

## 2021-02-27 ENCOUNTER — Encounter: Payer: Self-pay | Admitting: Family Medicine

## 2021-03-09 ENCOUNTER — Other Ambulatory Visit: Payer: Self-pay | Admitting: Family Medicine

## 2021-03-23 ENCOUNTER — Other Ambulatory Visit: Payer: Self-pay | Admitting: Family Medicine

## 2021-03-23 LAB — HM DEXA SCAN

## 2021-03-31 ENCOUNTER — Telehealth: Payer: Self-pay | Admitting: Family Medicine

## 2021-03-31 NOTE — Telephone Encounter (Signed)
I called the patient and informed the patient of her bone density results and she understood.  Patient stated she wanted to wait on the appointment that is scheduled on May 29 to discuss treatment.  Raunak Antuna,cma

## 2021-03-31 NOTE — Telephone Encounter (Signed)
Please let the patient know that her bone density scan came back with osteoporosis and revealed decrease in bone mineral density.  I would suggest we get her into the office to discuss treatment options and to also follow-up on her blood pressure.

## 2021-04-01 ENCOUNTER — Other Ambulatory Visit: Payer: Self-pay

## 2021-04-01 ENCOUNTER — Ambulatory Visit
Admission: RE | Admit: 2021-04-01 | Discharge: 2021-04-01 | Disposition: A | Discharge status: Home / Self Care | Payer: Medicare Other | Source: Ambulatory Visit | Attending: Family Medicine | Admitting: Family Medicine

## 2021-04-01 ENCOUNTER — Other Ambulatory Visit: Payer: Medicare Other

## 2021-04-01 DIAGNOSIS — Z1231 Encounter for screening mammogram for malignant neoplasm of breast: Secondary | ICD-10-CM | POA: Diagnosis present

## 2021-05-05 ENCOUNTER — Other Ambulatory Visit: Payer: Self-pay

## 2021-05-12 ENCOUNTER — Ambulatory Visit (INDEPENDENT_AMBULATORY_CARE_PROVIDER_SITE_OTHER): Payer: Medicare Other | Admitting: Family Medicine

## 2021-05-12 ENCOUNTER — Other Ambulatory Visit: Payer: Self-pay

## 2021-05-12 ENCOUNTER — Encounter: Payer: Self-pay | Admitting: Family Medicine

## 2021-05-12 DIAGNOSIS — R7303 Prediabetes: Secondary | ICD-10-CM

## 2021-05-12 DIAGNOSIS — M810 Age-related osteoporosis without current pathological fracture: Secondary | ICD-10-CM | POA: Diagnosis not present

## 2021-05-12 DIAGNOSIS — E785 Hyperlipidemia, unspecified: Secondary | ICD-10-CM | POA: Diagnosis not present

## 2021-05-12 DIAGNOSIS — I10 Essential (primary) hypertension: Secondary | ICD-10-CM

## 2021-05-12 LAB — VITAMIN D 25 HYDROXY (VIT D DEFICIENCY, FRACTURES): VITD: 39.21 ng/mL (ref 30.00–100.00)

## 2021-05-12 NOTE — Assessment & Plan Note (Signed)
Well-controlled.  She will continue losartan 100 mg once daily.  She will periodically check her blood pressure.  Discussed monitoring her blood pressures at home a couple times a week and if it starts to run higher than its current range she will let us know.

## 2021-05-12 NOTE — Assessment & Plan Note (Signed)
Generally stable.  Encouraged continued exercise.  Encouraged healthy diet.

## 2021-05-12 NOTE — Assessment & Plan Note (Signed)
Adequately controlled.  Continue simvastatin 20 mg once daily.

## 2021-05-12 NOTE — Patient Instructions (Signed)
Nice to see you. Please continue to monitor your blood pressure.  If it trends up please let me know. Please continue with diet and exercise. Please let me know if he would like to proceed with Fosamax or Prolia.  Alendronate Oral Solution What is this medicine? ALENDRONATE (a LEN droe nate) slows calcium loss from bones. It treatsosteoporosis. It may be used in other people at risk for bone loss. This medicine may be used for other purposes; ask your health care provider or pharmacist if you have questions. COMMON BRAND NAME(S): Fosamax What should I tell my health care provider before I take this medicine? They need to know if you have any of these conditions:  bleeding disorder  cancer  dental disease  difficulty swallowing  infection (fever, chills, cough, sore throat, pain or trouble passing urine)  kidney disease  low levels of calcium or other minerals in the blood  low red blood cell counts  receiving steroids like dexamethasone or prednisone  stomach or intestine problems  trouble sitting or standing for 30 minutes  an unusual or allergic reaction to alendronate, other drugs, foods, dyes or preservatives  pregnant or trying to get pregnant  breast-feeding How should I use this medicine? Take this drug by mouth with a full glass of water. Take it as directed on the prescription label at the same time every day. Use a specially marked oral syringe, spoon, or dropper to measure each dose. Ask your pharmacist if you do not have one. Household spoons are not accurate. Take the dose right after waking up. Do not eat or drink anything before taking it. Do not take it with any other drink except water. After taking it, do not eat breakfast, drink, or take any other drugs or vitamins for at least 30 minutes. Sit or stand up for at least 30 minutes after you take it. Do not lie down. Keep taking it unless your health care provider tells you to stop. A special MedGuide will be  given to you by the pharmacist with each prescription and refill. Be sure to read this information carefully each time. Talk to your health care provider about the use of this drug in children. Special care may be needed. Overdosage: If you think you have taken too much of this medicine contact a poison control center or emergency room at once. NOTE: This medicine is only for you. Do not share this medicine with others. What if I miss a dose? If you take your drug once a day, skip it. Take your next dose at the scheduled time the next morning. Do not take two doses on the same day. If you take your drug once a week, take the missed dose on the morning after you remember. Do not take two doses on the same day. What may interact with this medicine?  aluminum hydroxide  antacids  aspirin  calcium supplements  drugs for inflammation like ibuprofen, naproxen, and others  iron supplements  magnesium supplements  vitamins with minerals This list may not describe all possible interactions. Give your health care provider a list of all the medicines, herbs, non-prescription drugs, or dietary supplements you use. Also tell them if you smoke, drink alcohol, or use illegal drugs. Some items may interact with your medicine. What should I watch for while using this medicine? Visit your health care provider for regular checks on your progress. It may be some time before you see the benefit from this drug. Some people who take  this drug have severe bone, joint, or muscle pain. This drug may also increase your risk for jaw problems or a broken thigh bone. Tell your health care provider right away if you have severe pain in your jaw, bones, joints, or muscles. Tell you health care provider if you have any pain that does not go away or that gets worse. Tell your dentist and dental surgeon that you are taking this drug. You should not have major dental surgery while on this drug. See your dentist to have a  dental exam and fix any dental problems before starting this drug. Take good care of your teeth while on this drug. Make sure you see your dentist for regular follow-up appointments. You should make sure you get enough calcium and vitamin D while you are taking this drug. Discuss the foods you eat and the vitamins you take with your health care provider. You may need blood work done while you are taking this drug. What side effects may I notice from receiving this medicine? Side effects that you should report to your doctor or health care provider as soon as possible:  allergic reactions (skin rash, itching or hives; swelling of the face, lips, or tongue)  bone pain  heartburn (burning feeling in chest, often after eating or when lying down)  jaw pain, especially after dental work  joint pain  low calcium levels (fast heartbeat; muscle cramps or pain; pain, tingling, or numbness in the hands or feet; seizures)  muscle pain  painful or difficulty swallowing  redness, blistering, peeling, or loosening of the skin, including inside the mouth  stomach bleeding (bloody or black, tarry stools; spitting up blood or brown material that looks like coffee grounds) Side effects that usually do not require medical attention (report to your doctor or health care provider if they continue or are bothersome):  constipation  diarrhea  nausea This list may not describe all possible side effects. Call your doctor for medical advice about side effects. You may report side effects to FDA at 1-800-FDA-1088. Where should I keep my medicine? Keep out of the reach of children and pets. Store at room temperature between 20 and 25 degrees C (68 and 77 degrees F). Do not freeze. Throw away any unused drug after the expiration date. NOTE: This sheet is a summary. It may not cover all possible information. If you have questions about this medicine, talk to your doctor, pharmacist, or health care provider.   2021 Elsevier/Gold Standard (2019-10-24 20:38:30)  Denosumab injection What is this medicine? DENOSUMAB (den oh sue mab) slows bone breakdown. Prolia is used to treat osteoporosis in women after menopause and in men, and in people who are taking corticosteroids for 6 months or more. Delton See is used to treat a high calcium level due to cancer and to prevent bone fractures and other bone problems caused by multiple myeloma or cancer bone metastases. Delton See is also used to treat giant cell tumor of the bone. This medicine may be used for other purposes; ask your health care provider or pharmacist if you have questions. COMMON BRAND NAME(S): Prolia, XGEVA What should I tell my health care provider before I take this medicine? They need to know if you have any of these conditions:  dental disease  having surgery or tooth extraction  infection  kidney disease  low levels of calcium or Vitamin D in the blood  malnutrition  on hemodialysis  skin conditions or sensitivity  thyroid or parathyroid disease  an  unusual reaction to denosumab, other medicines, foods, dyes, or preservatives  pregnant or trying to get pregnant  breast-feeding How should I use this medicine? This medicine is for injection under the skin. It is given by a health care professional in a hospital or clinic setting. A special MedGuide will be given to you before each treatment. Be sure to read this information carefully each time. For Prolia, talk to your pediatrician regarding the use of this medicine in children. Special care may be needed. For Delton See, talk to your pediatrician regarding the use of this medicine in children. While this drug may be prescribed for children as young as 13 years for selected conditions, precautions do apply. Overdosage: If you think you have taken too much of this medicine contact a poison control center or emergency room at once. NOTE: This medicine is only for you. Do not share this  medicine with others. What if I miss a dose? It is important not to miss your dose. Call your doctor or health care professional if you are unable to keep an appointment. What may interact with this medicine? Do not take this medicine with any of the following medications:  other medicines containing denosumab This medicine may also interact with the following medications:  medicines that lower your chance of fighting infection  steroid medicines like prednisone or cortisone This list may not describe all possible interactions. Give your health care provider a list of all the medicines, herbs, non-prescription drugs, or dietary supplements you use. Also tell them if you smoke, drink alcohol, or use illegal drugs. Some items may interact with your medicine. What should I watch for while using this medicine? Visit your doctor or health care professional for regular checks on your progress. Your doctor or health care professional may order blood tests and other tests to see how you are doing. Call your doctor or health care professional for advice if you get a fever, chills or sore throat, or other symptoms of a cold or flu. Do not treat yourself. This drug may decrease your body's ability to fight infection. Try to avoid being around people who are sick. You should make sure you get enough calcium and vitamin D while you are taking this medicine, unless your doctor tells you not to. Discuss the foods you eat and the vitamins you take with your health care professional. See your dentist regularly. Brush and floss your teeth as directed. Before you have any dental work done, tell your dentist you are receiving this medicine. Do not become pregnant while taking this medicine or for 5 months after stopping it. Talk with your doctor or health care professional about your birth control options while taking this medicine. Women should inform their doctor if they wish to become pregnant or think they might  be pregnant. There is a potential for serious side effects to an unborn child. Talk to your health care professional or pharmacist for more information. What side effects may I notice from receiving this medicine? Side effects that you should report to your doctor or health care professional as soon as possible:  allergic reactions like skin rash, itching or hives, swelling of the face, lips, or tongue  bone pain  breathing problems  dizziness  jaw pain, especially after dental work  redness, blistering, peeling of the skin  signs and symptoms of infection like fever or chills; cough; sore throat; pain or trouble passing urine  signs of low calcium like fast heartbeat, muscle cramps or  muscle pain; pain, tingling, numbness in the hands or feet; seizures  unusual bleeding or bruising  unusually weak or tired Side effects that usually do not require medical attention (report to your doctor or health care professional if they continue or are bothersome):  constipation  diarrhea  headache  joint pain  loss of appetite  muscle pain  runny nose  tiredness  upset stomach This list may not describe all possible side effects. Call your doctor for medical advice about side effects. You may report side effects to FDA at 1-800-FDA-1088. Where should I keep my medicine? This medicine is only given in a clinic, doctor's office, or other health care setting and will not be stored at home. NOTE: This sheet is a summary. It may not cover all possible information. If you have questions about this medicine, talk to your doctor, pharmacist, or health care provider.  2021 Elsevier/Gold Standard (2018-04-14 16:10:44)

## 2021-05-12 NOTE — Assessment & Plan Note (Addendum)
Discussed options for treatment including Fosamax and Prolia.  I also mentioned IV treatment as well though I do not think she necessarily needs this at this time.  I provided her with information on Fosamax and Prolia.  She will review this and let us know which direction she would like to proceed.  I discussed the risk of osteonecrosis of the jaw with Prolia and discussed that this risk seems to be low and typically is associated with dental work.  We will check a vitamin D today.  Discussed appropriate calcium intake of 1200 mg/day.

## 2021-05-12 NOTE — Progress Notes (Signed)
Tommi Rumps, MD Phone: [phone]  Stephanie Mora is a 80 y.o. female who presents today for f/u.   HYPERTENSION  Disease Monitoring  Home BP Monitoring 120s-130s/60s-70s Chest pain- no    Dyspnea- no Medications  Compliance-  Taking losartan.   Edema- no  HYPERLIPIDEMIA Symptoms Chest pain on exertion:  no Medications: Compliance- taking simvastatin Right upper quadrant pain- no  Muscle aches- no  Prediabetes: A1c 6.0.  She is been doing Silver sneakers 1-2 times a week.  She is also doing yard work.  Osteoporosis: Noted on recent DEXA scan.  Also had elevated fracture risk on FRAX score.  She does take vitamin D.  She gets her calcium through her diet and with a vitamin supplement.  No recent fractures.  She was on an oral medication and notes she did an injection in the past for this.  She reports no history of esophageal ulcers or stomach ulcers.  No dysphagia.  She reports recent EGD for epigastric discomfort that did not reveal a cause.    Social History   Tobacco Use  Smoking Status Former Smoker  . Years: 25.00  . Types: Cigarettes  . Quit date: 12/20/1993  . Years since quitting: 27.4  Smokeless Tobacco Never Used    Current Outpatient Medications on File Prior to Visit  Medication Sig Dispense Refill  . Acetylcysteine (N-ACETYL-L-CYSTEINE PO) Take 600 mcg by mouth 2 (two) times daily.    Marland Kitchen aspirin 81 MG tablet Take 81 mg by mouth daily.    . Cholecalciferol (VITAMIN D3) 2000 UNITS TABS Take 1 tablet by mouth daily.    . Coenzyme Q10 (COQ10) 100 MG CAPS Take 1 capsule by mouth daily.    . Emollient (COLLAGEN EX) Apply topically.    . fluticasone (FLONASE) 50 MCG/ACT nasal spray Place 2 sprays into the nose as needed.    Marland Kitchen losartan (COZAAR) 100 MG tablet Take 1 tablet (100 mg total) by mouth daily. 90 tablet 1  . MISC NATURAL PRODUCT OP Take by mouth. Tumeric 500mg     . Multiple Vitamin (MULTIVITAMIN) tablet Take 1 tablet by mouth daily.    Vladimir Faster  Glycol-Propyl Glycol (SYSTANE) 0.4-0.3 % SOLN Apply to eye.    . simvastatin (ZOCOR) 20 MG tablet TAKE 1 TABLET(20 MG) BY MOUTH EVERY EVENING 90 tablet 0  . venlafaxine XR (EFFEXOR-XR) 150 MG 24 hr capsule TAKE 1 CAPSULE BY MOUTH EVERY DAY 90 capsule 0  . vitamin C (ASCORBIC ACID) 500 MG tablet Take 500 mg by mouth 2 (two) times daily.     No current facility-administered medications on file prior to visit.     ROS see history of present illness  Objective  Physical Exam Vitals:   05/12/21 0806  BP: 122/60  Pulse: 78  Temp: 98 F (36.7 C)  SpO2: 97%    BP Readings from Last 3 Encounters:  05/12/21 122/60  02/20/21 (!) 147/79  02/11/21 140/80   Wt Readings from Last 3 Encounters:  05/12/21 164 lb (74.4 kg)  02/11/21 164 lb 6.4 oz (74.6 kg)  11/18/20 161 lb (73 kg)    Physical Exam Constitutional:      General: She is not in acute distress.    Appearance: She is not diaphoretic.  Cardiovascular:     Rate and Rhythm: Normal rate and regular rhythm.     Heart sounds: Normal heart sounds.  Pulmonary:     Effort: Pulmonary effort is normal.     Breath sounds: Normal breath  sounds.  Musculoskeletal:     Right lower leg: No edema.     Left lower leg: No edema.  Skin:    General: Skin is warm and dry.  Neurological:     Mental Status: She is alert.      Assessment/Plan: Please see individual problem list.  Problem List Items Addressed This Visit    Osteoporosis (Chronic)    Discussed options for treatment including Fosamax and Prolia.  I also mentioned IV treatment as well though I do not think she necessarily needs this at this time.  I provided her with information on Fosamax and Prolia.  She will review this and let us know which direction she would like to proceed.  I discussed the risk of osteonecrosis of the jaw with Prolia and discussed that this risk seems to be low and typically is associated with dental work.  We will check a vitamin D today.  Discussed  appropriate calcium intake of 1200 mg/day.      Relevant Orders   Vitamin D (25 hydroxy)   Hyperlipidemia (Chronic)    Adequately controlled.  Continue simvastatin 20 mg once daily.      Pre-diabetes    Generally stable.  Encouraged continued exercise.  Encouraged healthy diet.      Hypertension    Well-controlled.  She will continue losartan 100 mg once daily.  She will periodically check her blood pressure.  Discussed monitoring her blood pressures at home a couple times a week and if it starts to run higher than its current range she will let us know.        Return in about 6 months (around 11/12/2021) for Hypertension, osteoporosis.  This visit occurred during the SARS-CoV-2 public health emergency.  Safety protocols were in place, including screening questions prior to the visit, additional usage of staff PPE, and extensive cleaning of exam room while observing appropriate contact time as indicated for disinfecting solutions.    Tommi Rumps, MD [facility]

## 2021-06-02 ENCOUNTER — Encounter: Payer: Self-pay | Admitting: Family Medicine

## 2021-06-04 ENCOUNTER — Other Ambulatory Visit: Payer: Self-pay | Admitting: Family Medicine

## 2021-06-10 ENCOUNTER — Telehealth: Payer: Self-pay | Admitting: *Deleted

## 2021-06-10 NOTE — Telephone Encounter (Signed)
Needs prolia see My chart message.

## 2021-06-12 ENCOUNTER — Telehealth: Payer: Self-pay | Admitting: *Deleted

## 2021-06-12 NOTE — Telephone Encounter (Signed)
Insurance verification for Prolia filed on [employer] Portal. 

## 2021-06-15 ENCOUNTER — Encounter: Payer: Self-pay | Admitting: Family Medicine

## 2021-06-15 NOTE — Telephone Encounter (Signed)
Patient has been approved for Prolia okay to schedule?

## 2021-06-16 NOTE — Telephone Encounter (Signed)
It is ok to start prolia.

## 2021-06-17 NOTE — Telephone Encounter (Signed)
Patient has ben scheduled for first Prolia injection. 06/23/21

## 2021-06-23 ENCOUNTER — Other Ambulatory Visit: Payer: Self-pay

## 2021-06-23 ENCOUNTER — Ambulatory Visit (INDEPENDENT_AMBULATORY_CARE_PROVIDER_SITE_OTHER): Payer: Medicare Other

## 2021-06-23 DIAGNOSIS — M810 Age-related osteoporosis without current pathological fracture: Secondary | ICD-10-CM

## 2021-06-23 MED ORDER — DENOSUMAB 60 MG/ML ~~LOC~~ SOSY
60.0000 mg | PREFILLED_SYRINGE | Freq: Once | SUBCUTANEOUS | Status: AC
  Administered 2021-06-23: 60 mg via SUBCUTANEOUS

## 2021-06-23 NOTE — Progress Notes (Signed)
Patient presented for 6-month Prolia injection SQ to left arm. Patient tolerated well. 

## 2021-09-02 ENCOUNTER — Other Ambulatory Visit: Payer: Self-pay | Admitting: Family Medicine

## 2021-10-01 ENCOUNTER — Other Ambulatory Visit: Payer: Self-pay | Admitting: Family Medicine

## 2021-10-01 DIAGNOSIS — I10 Essential (primary) hypertension: Secondary | ICD-10-CM

## 2021-10-07 ENCOUNTER — Telehealth: Payer: Self-pay | Admitting: Family Medicine

## 2021-10-07 NOTE — Telephone Encounter (Signed)
Patient is calling in to request a 90 day supply on her losartan (COZAAR) 100 MG tablet.Please send to the [facility] on [address]

## 2021-10-08 NOTE — Telephone Encounter (Signed)
Medication has already been refilled on 10/02/21.

## 2021-11-16 ENCOUNTER — Ambulatory Visit: Payer: Medicare Other | Admitting: Family Medicine

## 2021-11-16 ENCOUNTER — Encounter: Payer: Self-pay | Admitting: Family Medicine

## 2021-11-19 ENCOUNTER — Ambulatory Visit (INDEPENDENT_AMBULATORY_CARE_PROVIDER_SITE_OTHER): Payer: Medicare Other

## 2021-11-19 VITALS — Ht 62.0 in | Wt 164.0 lb

## 2021-11-19 DIAGNOSIS — Z0000 Encounter for general adult medical examination without abnormal findings: Secondary | ICD-10-CM | POA: Diagnosis not present

## 2021-11-19 NOTE — Patient Instructions (Addendum)
Stephanie Mora , Thank you for taking time to come for your Medicare Wellness Visit. I appreciate your ongoing commitment to your health goals. Please review the following plan we discussed and let me know if I can assist you in the future.   These are the goals we discussed:  Goals       Patient Stated     Healthy lifestyle (pt-stated)      Increase physical activity I would like to lose a little weight Monitor carb intake        This is a list of the screening recommended for you and due dates:  Health Maintenance  Topic Date Due   Zoster (Shingles) Vaccine (1 of 2) 02/17/2022*   Tetanus Vaccine  01/08/2031   Pneumonia Vaccine  Completed   Flu Shot  Completed   DEXA scan (bone density measurement)  Completed   COVID-19 Vaccine  Completed   HPV Vaccine  Aged Out  *Topic was postponed. The date shown is not the original due date.    Advanced directives: End of life planning; Advance aging; Advanced directives discussed.  Copy of current HCPOA/Living Will requested.    Conditions/risks identified: none new  Follow up in one year for your annual wellness visit    Preventive Care 65 Years and Older, Female Preventive care refers to lifestyle choices and visits with your health care provider that can promote health and wellness. What does preventive care include? A yearly physical exam. This is also called an annual well check. Dental exams once or twice a year. Routine eye exams. Ask your health care provider how often you should have your eyes checked. Personal lifestyle choices, including: Daily care of your teeth and gums. Regular physical activity. Eating a healthy diet. Avoiding tobacco and drug use. Limiting alcohol use. Practicing safe sex. Taking low-dose aspirin every day. Taking vitamin and mineral supplements as recommended by your health care provider. What happens during an annual well check? The services and screenings done by your health care provider  during your annual well check will depend on your age, overall health, lifestyle risk factors, and family history of disease. Counseling  Your health care provider may ask you questions about your: Alcohol use. Tobacco use. Drug use. Emotional well-being. Home and relationship well-being. Sexual activity. Eating habits. History of falls. Memory and ability to understand (cognition). Work and work Statistician. Reproductive health. Screening  You may have the following tests or measurements: Height, weight, and BMI. Blood pressure. Lipid and cholesterol levels. These may be checked every 5 years, or more frequently if you are over 35 years old. Skin check. Lung cancer screening. You may have this screening every year starting at age 32 if you have a 30-pack-year history of smoking and currently smoke or have quit within the past 15 years. Fecal occult blood test (FOBT) of the stool. You may have this test every year starting at age 32. Flexible sigmoidoscopy or colonoscopy. You may have a sigmoidoscopy every 5 years or a colonoscopy every 10 years starting at age 45. Hepatitis C blood test. Hepatitis B blood test. Sexually transmitted disease (STD) testing. Diabetes screening. This is done by checking your blood sugar (glucose) after you have not eaten for a while (fasting). You may have this done every 1-3 years. Bone density scan. This is done to screen for osteoporosis. You may have this done starting at age 63. Mammogram. This may be done every 1-2 years. Talk to your health care provider about how  often you should have regular mammograms. Talk with your health care provider about your test results, treatment options, and if necessary, the need for more tests. Vaccines  Your health care provider may recommend certain vaccines, such as: Influenza vaccine. This is recommended every year. Tetanus, diphtheria, and acellular pertussis (Tdap, Td) vaccine. You may need a Td booster every  10 years. Zoster vaccine. You may need this after age 37. Pneumococcal 13-valent conjugate (PCV13) vaccine. One dose is recommended after age 74. Pneumococcal polysaccharide (PPSV23) vaccine. One dose is recommended after age 50. Talk to your health care provider about which screenings and vaccines you need and how often you need them. This information is not intended to replace advice given to you by your health care provider. Make sure you discuss any questions you have with your health care provider. Document Released: 01/02/2016 Document Revised: 08/25/2016 Document Reviewed: 10/07/2015 Elsevier Interactive Patient Education  2017 [place] Prevention in the Home Falls can cause injuries. They can happen to people of all ages. There are many things you can do to make your home safe and to help prevent falls. What can I do on the outside of my home? Regularly fix the edges of walkways and driveways and fix any cracks. Remove anything that might make you trip as you walk through a door, such as a raised step or threshold. Trim any bushes or trees on the path to your home. Use bright outdoor lighting. Clear any walking paths of anything that might make someone trip, such as rocks or tools. Regularly check to see if handrails are loose or broken. Make sure that both sides of any steps have handrails. Any raised decks and porches should have guardrails on the edges. Have any leaves, snow, or ice cleared regularly. Use sand or salt on walking paths during winter. Clean up any spills in your garage right away. This includes oil or grease spills. What can I do in the bathroom? Use night lights. Install grab bars by the toilet and in the tub and shower. Do not use towel bars as grab bars. Use non-skid mats or decals in the tub or shower. If you need to sit down in the shower, use a plastic, non-slip stool. Keep the floor dry. Clean up any water that spills on the floor as soon as it  happens. Remove soap buildup in the tub or shower regularly. Attach bath mats securely with double-sided non-slip rug tape. Do not have throw rugs and other things on the floor that can make you trip. What can I do in the bedroom? Use night lights. Make sure that you have a light by your bed that is easy to reach. Do not use any sheets or blankets that are too big for your bed. They should not hang down onto the floor. Have a firm chair that has side arms. You can use this for support while you get dressed. Do not have throw rugs and other things on the floor that can make you trip. What can I do in the kitchen? Clean up any spills right away. Avoid walking on wet floors. Keep items that you use a lot in easy-to-reach places. If you need to reach something above you, use a strong step stool that has a grab bar. Keep electrical cords out of the way. Do not use floor polish or wax that makes floors slippery. If you must use wax, use non-skid floor wax. Do not have throw rugs and other things  on the floor that can make you trip. What can I do with my stairs? Do not leave any items on the stairs. Make sure that there are handrails on both sides of the stairs and use them. Fix handrails that are broken or loose. Make sure that handrails are as long as the stairways. Check any carpeting to make sure that it is firmly attached to the stairs. Fix any carpet that is loose or worn. Avoid having throw rugs at the top or bottom of the stairs. If you do have throw rugs, attach them to the floor with carpet tape. Make sure that you have a light switch at the top of the stairs and the bottom of the stairs. If you do not have them, ask someone to add them for you. What else can I do to help prevent falls? Wear shoes that: Do not have high heels. Have rubber bottoms. Are comfortable and fit you well. Are closed at the toe. Do not wear sandals. If you use a stepladder: Make sure that it is fully opened.  Do not climb a closed stepladder. Make sure that both sides of the stepladder are locked into place. Ask someone to hold it for you, if possible. Clearly mark and make sure that you can see: Any grab bars or handrails. First and last steps. Where the edge of each step is. Use tools that help you move around (mobility aids) if they are needed. These include: Canes. Walkers. Scooters. Crutches. Turn on the lights when you go into a dark area. Replace any light bulbs as soon as they burn out. Set up your furniture so you have a clear path. Avoid moving your furniture around. If any of your floors are uneven, fix them. If there are any pets around you, be aware of where they are. Review your medicines with your doctor. Some medicines can make you feel dizzy. This can increase your chance of falling. Ask your doctor what other things that you can do to help prevent falls. This information is not intended to replace advice given to you by your health care provider. Make sure you discuss any questions you have with your health care provider. Document Released: 10/02/2009 Document Revised: 05/13/2016 Document Reviewed: 01/10/2015 Elsevier Interactive Patient Education  2017 [employer].

## 2021-11-19 NOTE — Progress Notes (Signed)
Subjective:   Stephanie Mora is a 80 y.o. female who presents for Medicare Annual (Subsequent) preventive examination.  Review of Systems    No ROS.  Medicare Wellness Virtual Visit.  Visual/audio telehealth visit, UTA vital signs.   See social history for additional risk factors.   Cardiac Risk Factors include: advanced age (>38men, >78 women);diabetes mellitus;hypertension     Objective:    Today's Vitals   11/19/21 1455  Weight: 164 lb (74.4 kg)  Height: 5\' 2"  (1.575 m)   Body mass index is 30 kg/m.  Advanced Directives 11/19/2021 11/18/2020 01/08/2020 01/08/2020 11/07/2019 06/13/2019 05/23/2019  Does Patient Have a Medical Advance Directive? Yes Yes No Yes Yes Yes Yes  Type of Paramedic of [place];Living will [place];Living will - - [place];Living will [place];Living will Living will;Healthcare Power of Attorney  Does patient want to make changes to medical advance directive? No - Patient declined No - Patient declined No - Patient declined - No - Patient declined No - Patient declined No - Patient declined  Copy of [place] in Chart? No - copy requested No - copy requested - - No - copy requested No - copy requested No - copy requested  Would patient like information on creating a medical advance directive? - - No - Patient declined - - - -    Current Medications (verified) Outpatient Encounter Medications as of 11/19/2021  Medication Sig   Acetylcysteine (N-ACETYL-L-CYSTEINE PO) Take 600 mcg by mouth 2 (two) times daily.   aspirin 81 MG tablet Take 81 mg by mouth daily.   Cholecalciferol (VITAMIN D3) 2000 UNITS TABS Take 1 tablet by mouth daily.   Coenzyme Q10 (COQ10) 100 MG CAPS Take 1 capsule by mouth daily.   Emollient (COLLAGEN EX) Apply topically.   fluticasone (FLONASE) 50 MCG/ACT nasal spray Place 2 sprays into the nose as needed.   losartan (COZAAR) 100 MG  tablet TAKE 1 TABLET(100 MG) BY MOUTH DAILY   MISC NATURAL PRODUCT OP Take by mouth. Tumeric 500mg    Multiple Vitamin (MULTIVITAMIN) tablet Take 1 tablet by mouth daily.   Polyethyl Glycol-Propyl Glycol (SYSTANE) 0.4-0.3 % SOLN Apply to eye.   simvastatin (ZOCOR) 20 MG tablet TAKE 1 TABLET(20 MG) BY MOUTH EVERY EVENING   venlafaxine XR (EFFEXOR-XR) 150 MG 24 hr capsule TAKE 1 CAPSULE BY MOUTH EVERY DAY   vitamin C (ASCORBIC ACID) 500 MG tablet Take 500 mg by mouth 2 (two) times daily.   No facility-administered encounter medications on file as of 11/19/2021.    Allergies (verified) Patient has no known allergies.   History: Past Medical History:  Diagnosis Date   Bilateral dry eyes    Breast cancer ([place]) 2000   breast right, lumpectomy and XRT at 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   Heart murmur    as child   Hyperlipidemia    Osteoporosis    reclast treatment in past   Personal history of radiation therapy 2000   F/U right breast cancer   Past Surgical History:  Procedure Laterality Date   BREAST BIOPSY Right 2000   +   BREAST BIOPSY Left 1980's   neg, Dr Bary Castilla   BREAST LUMPECTOMY Right 2000   at [place] Right 05/23/2019   Procedure: CATARACT EXTRACTION PHACO AND INTRAOCULAR LENS PLACEMENT ([place])  RIGHT TORIC LENS;  Surgeon: Leandrew Koyanagi, MD;  Location: [place];  Service: Ophthalmology;  Laterality: Right;   CATARACT EXTRACTION W/PHACO Left 06/13/2019   Procedure: CATARACT EXTRACTION PHACO AND INTRAOCULAR LENS PLACEMENT ([place]) LEFT TORIC LENS;  Surgeon: Leandrew Koyanagi, MD;  Location: [place];  Service: Ophthalmology;  Laterality: Left;   Family History  Problem Relation Age of Onset   Cancer Mother    Breast cancer Mother 23   Heart disease Father    Heart attack Father 83   Cancer Maternal Aunt    Breast cancer Maternal Aunt        ? age   Breast cancer  Paternal Aunt    Social History   Socioeconomic History   Marital status: Married    Spouse name: Not on file   Number of children: Not on file   Years of education: Not on file   Highest education level: Not on file  Occupational History   Not on file  Tobacco Use   Smoking status: Former    Years: 25.00    Types: Cigarettes    Quit date: 12/20/1993    Years since quitting: 27.9   Smokeless tobacco: Never  Vaping Use   Vaping Use: Never used  Substance and Sexual Activity   Alcohol use: Yes    Comment: occasional   Drug use: No   Sexual activity: Not on file  Other Topics Concern   Not on file  Social History Narrative   Lives in [place] with husband.      Diet - healthy diet   Exercise - [employer] weight training   Social Determinants of Health   Financial Resource Strain: Low Risk    Difficulty of Paying Living Expenses: Not hard at all  Food Insecurity: No Food Insecurity   Worried About Charity fundraiser in the Last Year: Never true   Ran Out of Food in the Last Year: Never true  Transportation Needs: No Transportation Needs   Lack of Transportation (Medical): No   Lack of Transportation (Non-Medical): No  Physical Activity: Insufficiently Active   Days of Exercise per Week: 2 days   Minutes of Exercise per Session: 60 min  Stress: No Stress Concern Present   Feeling of Stress : Not at all  Social Connections: Unknown   Frequency of Communication with Friends and Family: More than three times a week   Frequency of Social Gatherings with Friends and Family: More than three times a week   Attends Religious Services: More than 4 times per year   Active Member of [employer] or Organizations: Not on file   Attends Archivist Meetings: Not on file   Marital Status: Married    Tobacco Counseling Counseling given: Not Answered   Clinical Intake:  Pre-visit preparation completed: Yes        Diabetes: Yes  How often do you need to have  someone help you when you read instructions, pamphlets, or other written materials from your doctor or pharmacy?: 1 - Never    Interpreter Needed?: No    Activities of Daily Living In your present state of health, do you have any difficulty performing the following activities: 11/19/2021  Hearing? N  Vision? N  Difficulty concentrating or making decisions? N  Walking or climbing stairs? N  Dressing or bathing? N  Doing errands, shopping? N  Preparing Food and eating ? N  Using the Toilet? N  In the past six months, have you accidently leaked urine? N  Do you have problems with loss of bowel  control? N  Managing your Medications? N  Managing your Finances? N  Housekeeping or managing your Housekeeping? N  Some recent data might be hidden    Patient Care Team: Leone Haven, MD as PCP - General (Family Medicine)  Indicate any recent Medical Services you may have received from other than [facility] providers in the past year (date may be approximate).     Assessment:   This is a routine wellness examination for [place].  Virtual Visit via Telephone Note  I connected with  Stephanie Mora on 11/19/21 at  2:45 PM EST by telephone and verified that I am speaking with the correct person using two identifiers.  Location: Patient: home Provider: office Persons participating in the virtual visit: patient/Nurse Health Advisor   I discussed the limitations, risks, security and privacy concerns of performing an evaluation and management service by telephone and the availability of in person appointments. The patient expressed understanding and agreed to proceed.  Interactive audio and video telecommunications were attempted between this nurse and patient, however failed, due to patient having technical difficulties OR patient did not have access to video capability.  We continued and completed visit with audio only.  Some vital signs may be absent or patient reported.    Hearing/Vision screen Hearing Screening - Comments:: Patient is able to hear conversational tones without difficulty. No issues reported.  Vision Screening - Comments:: Wears corrective lenses  They have seen their ophthalmologist in the last 12 months.  Dietary issues and exercise activities discussed: Current Exercise Habits: Home exercise routine, Type of exercise: stretching, Time (Minutes): 45, Frequency (Times/Week): 2, Weekly Exercise (Minutes/Week): 90, Intensity: Mild Regular diet Fair water intake   Goals Addressed               This Visit's Progress     Patient Stated     Healthy lifestyle (pt-stated)        Increase physical activity I would like to lose a little weight Monitor carb intake       Depression Screen PHQ 2/9 Scores 11/19/2021 05/12/2021 11/18/2020 11/03/2020 02/01/2020 11/07/2019 07/31/2019  PHQ - 2 Score 0 0 0 0 0 0 0    Fall Risk Fall Risk  11/19/2021 05/12/2021 02/11/2021 11/03/2020 02/01/2020  Falls in the past year? 0 0 0 0 0  Number falls in past yr: 0 0 0 0 0  Injury with Fall? 0 - - - -  Follow up Falls evaluation completed Falls evaluation completed Falls evaluation completed Falls evaluation completed Falls evaluation completed    [place]: Home free of loose throw rugs in walkways, pet beds, electrical cords, etc? Yes  Adequate lighting in your home to reduce risk of falls? Yes   ASSISTIVE DEVICES UTILIZED TO PREVENT FALLS: Life alert? No  Use of a cane, walker or w/c? No  Grab bars in the bathroom? Yes   TIMED UP AND GO: Was the test performed? No .   Cognitive Function:  Patient is alert and oriented x3. Enjoys reading, brain stimulating games and socializing.   6CIT Screen 11/07/2019  What Year? 0 points  What month? 0 points  What time? 0 points  Count back from 20 0 points  Months in reverse 0 points  Repeat phrase 0 points  Total Score 0    Immunizations Immunization History   Administered Date(s) Administered   Fluad Quad(high Dose 65+) 10/04/2019, 10/03/2021   Influenza, High Dose Seasonal PF 10/11/2014, 11/01/2015, 10/27/2016,  10/15/2017, 10/26/2018   Influenza-Unspecified 10/26/2012, 10/29/2013, 10/26/2014, 10/28/2015, 10/15/2020   PFIZER(Purple Top)SARS-COV-2 Vaccination 01/11/2020, 02/01/2020, 10/01/2020   Pfizer Covid-19 Vaccine Bivalent Booster 5y-11y 09/19/2021   Pneumococcal Conjugate-13 05/23/2014   Pneumococcal Polysaccharide-23 07/06/2011   Tdap 04/06/2011, 01/08/2021   Zoster, Live 07/06/2011   TDAP status: Due, Education has been provided regarding the importance of this vaccine. Advised may receive this vaccine at local pharmacy or Health Dept. Aware to provide a copy of the vaccination record if obtained from local pharmacy or Health Dept. Verbalized acceptance and understanding. Deferred.   Shingrix Completed?: No.    Education has been provided regarding the importance of this vaccine. Patient has been advised to call insurance company to determine out of pocket expense if they have not yet received this vaccine. Advised may also receive vaccine at local pharmacy or Health Dept. Verbalized acceptance and understanding.  Screening Tests Health Maintenance  Topic Date Due   Zoster Vaccines- Shingrix (1 of 2) 02/17/2022 (Originally 06[birth year]/1991)   TETANUS/TDAP  01/08/2031   Pneumonia Vaccine 52+ Years old  Completed   INFLUENZA VACCINE  Completed   DEXA SCAN  Completed   COVID-19 Vaccine  Completed   HPV VACCINES  Aged Out   Health Maintenance There are no preventive care reminders to display for this patient.  Colorectal cancer screening: No longer required.   Mammogram status: Completed 04/01/21. Repeat every year.  Lung Cancer Screening: (Low Dose CT Chest recommended if Age 40-80 years, 30 pack-year currently smoking OR have quit w/in 15years.) does not qualify.   Hepatitis C Screening: does not qualify  Vision Screening: Recommended  annual ophthalmology exams for early detection of glaucoma and other disorders of the eye.  Dental Screening: Recommended annual dental exams for proper oral hygiene  Community Resource Referral / Chronic Care Management: CRR required this visit?  No   CCM required this visit?  No      Plan:   Keep all routine maintenance appointments.   I have personally reviewed and noted the following in the patient's chart:   Medical and social history Use of alcohol, tobacco or illicit drugs  Current medications and supplements including opioid prescriptions. Not taking opioid.  Functional ability and status Nutritional status Physical activity Advanced directives List of other physicians Hospitalizations, surgeries, and ER visits in previous 12 months Vitals Screenings to include cognitive, depression, and falls Referrals and appointments  In addition, I have reviewed and discussed with patient certain preventive protocols, quality metrics, and best practice recommendations. A written personalized care plan for preventive services as well as general preventive health recommendations were provided to patient.     Varney Biles, LPN   16/12/958

## 2021-11-23 ENCOUNTER — Telehealth: Payer: Self-pay | Admitting: *Deleted

## 2021-11-23 NOTE — Telephone Encounter (Signed)
-----   Message from Dia Crawford, LPN sent at 06/24/1517  2:59 PM EST ----- Regarding: prolia Patient would like for you/someone to contact/schedule her regarding the next prolia so she does not miss the appointment. She is concerned that she will not be contacted to schedule since Dr. Caryl Bis is out until February. I explained how it works-she verbalized understanding BUT would like for someone to make sure she gets scheduled on time. Her last prolia was 06/2021 and understands next should be Jan 2023.   Thanks, Denisa

## 2021-11-23 NOTE — Telephone Encounter (Signed)
Called patient and left message to call office please forward to nurse concerning Prolia.

## 2021-11-23 NOTE — Telephone Encounter (Signed)
Check [employer] verification after 12/28/2021

## 2021-12-15 ENCOUNTER — Other Ambulatory Visit: Payer: Self-pay | Admitting: Family Medicine

## 2021-12-22 ENCOUNTER — Telehealth: Payer: Self-pay | Admitting: Family Medicine

## 2021-12-25 NOTE — Telephone Encounter (Signed)
I called the pharmacy and they stated the medication has been there since 12/23/2021 and they are just waiting on the patient to pick up, I called the patient and informed her and she understood.  Jasir Rother,cma

## 2021-12-25 NOTE — Telephone Encounter (Signed)
Pt called in stating that her pharmacy advised her that her medication (venlafaxine XR (EFFEXOR-XR) 150 MG 24 hr capsule) was not sent over. I advise Pt that medication was received by pharmacy on 12/23/2021 at 2:52pm.Pt stated that she is running low on medication. Pt stated that pharmacy never received it. Pt requesting callback with update on medication order.

## 2021-12-28 ENCOUNTER — Telehealth: Payer: Self-pay | Admitting: Family Medicine

## 2021-12-28 NOTE — Telephone Encounter (Signed)
[employer] Key: Heidi Dach - PA Case ID: [id number] Need help? Call us at [phone] Outcome Additional Information Required This medication or product is on your plan's list of covered drugs. Prior authorization is not required at this time. If your pharmacy has questions regarding the processing of your prescription, please have them call the [facility] pharmacy help desk at [phone]. **Please note: This request was submitted electronically. Formulary lowering, tiering exception, cost reduction and/or pre-benefit determination review (including prospective Medicare hospice reviews) requests cannot be requested using this method of submission. Providers contact us at [phone] for further assistance.

## 2022-01-07 ENCOUNTER — Ambulatory Visit (INDEPENDENT_AMBULATORY_CARE_PROVIDER_SITE_OTHER): Payer: Medicare Other

## 2022-01-07 ENCOUNTER — Other Ambulatory Visit: Payer: Self-pay

## 2022-01-07 DIAGNOSIS — M810 Age-related osteoporosis without current pathological fracture: Secondary | ICD-10-CM | POA: Diagnosis not present

## 2022-01-07 MED ORDER — DENOSUMAB 60 MG/ML ~~LOC~~ SOSY
60.0000 mg | PREFILLED_SYRINGE | Freq: Once | SUBCUTANEOUS | Status: AC
  Administered 2022-01-07: 60 mg via SUBCUTANEOUS

## 2022-01-07 NOTE — Progress Notes (Signed)
Stephanie Mora presents today for injection per MD orders. Prolia administered SQ in right Upper Arm. Administration without incident. Patient tolerated well.  Mariyam Remington,cma

## 2022-02-08 ENCOUNTER — Other Ambulatory Visit: Payer: Self-pay

## 2022-02-08 DIAGNOSIS — Z0001 Encounter for general adult medical examination with abnormal findings: Secondary | ICD-10-CM

## 2022-02-10 ENCOUNTER — Other Ambulatory Visit (INDEPENDENT_AMBULATORY_CARE_PROVIDER_SITE_OTHER): Payer: Medicare Other

## 2022-02-10 ENCOUNTER — Other Ambulatory Visit: Payer: Self-pay

## 2022-02-10 DIAGNOSIS — Z0001 Encounter for general adult medical examination with abnormal findings: Secondary | ICD-10-CM

## 2022-02-11 ENCOUNTER — Telehealth: Payer: Self-pay

## 2022-02-11 LAB — CBC
HCT: 42.1 % (ref 36.0–46.0)
Hemoglobin: 13.9 g/dL (ref 12.0–15.0)
MCHC: 33.1 g/dL (ref 30.0–36.0)
MCV: 87.4 fl (ref 78.0–100.0)
Platelets: 266 10*3/uL (ref 150.0–400.0)
RBC: 4.82 Mil/uL (ref 3.87–5.11)
RDW: 13.9 % (ref 11.5–15.5)
WBC: 8.4 10*3/uL (ref 4.0–10.5)

## 2022-02-11 LAB — COMPREHENSIVE METABOLIC PANEL
ALT: 19 U/L (ref 0–35)
AST: 23 U/L (ref 0–37)
Albumin: 4.4 g/dL (ref 3.5–5.2)
Alkaline Phosphatase: 57 U/L (ref 39–117)
BUN: 24 mg/dL — ABNORMAL HIGH (ref 6–23)
CO2: 35 mEq/L — ABNORMAL HIGH (ref 19–32)
Calcium: 9.8 mg/dL (ref 8.4–10.5)
Chloride: 98 mEq/L (ref 96–112)
Creatinine, Ser: 0.86 mg/dL (ref 0.40–1.20)
GFR: 63.69 mL/min (ref >60.00)
Glucose, Bld: 80 mg/dL (ref 70–99)
Potassium: 4.5 mEq/L (ref 3.5–5.1)
Sodium: 142 mEq/L (ref 135–145)
Total Bilirubin: 0.5 mg/dL (ref 0.2–1.2)
Total Protein: 6.8 g/dL (ref 6.0–8.3)

## 2022-02-11 LAB — LIPID PANEL
Cholesterol: 181 mg/dL (ref 0–200)
HDL: 62.4 mg/dL (ref >39.00)
NonHDL: 118.39
Total CHOL/HDL Ratio: 3
Triglycerides: 288 mg/dL — ABNORMAL HIGH (ref 0.0–149.0)
VLDL: 57.6 mg/dL — ABNORMAL HIGH (ref 0.0–40.0)

## 2022-02-11 LAB — LDL CHOLESTEROL, DIRECT: Direct LDL: 82 mg/dL

## 2022-02-11 LAB — HEMOGLOBIN A1C: Hgb A1c MFr Bld: 6 % (ref 4.6–6.5)

## 2022-02-11 LAB — TSH: TSH: 1.26 u[IU]/mL (ref 0.35–5.50)

## 2022-02-11 LAB — VITAMIN D 25 HYDROXY (VIT D DEFICIENCY, FRACTURES): VITD: 38.06 ng/mL (ref 30.00–100.00)

## 2022-02-11 NOTE — Telephone Encounter (Signed)
Attempted to call pt to complete pre-visit planning. LM for pt to cb

## 2022-02-12 ENCOUNTER — Encounter: Payer: Self-pay | Admitting: Family Medicine

## 2022-02-12 ENCOUNTER — Ambulatory Visit (INDEPENDENT_AMBULATORY_CARE_PROVIDER_SITE_OTHER): Payer: Medicare Other | Admitting: Family Medicine

## 2022-02-12 ENCOUNTER — Other Ambulatory Visit: Payer: Self-pay

## 2022-02-12 DIAGNOSIS — R251 Tremor, unspecified: Secondary | ICD-10-CM

## 2022-02-12 DIAGNOSIS — Z0001 Encounter for general adult medical examination with abnormal findings: Secondary | ICD-10-CM | POA: Diagnosis not present

## 2022-02-12 DIAGNOSIS — I10 Essential (primary) hypertension: Secondary | ICD-10-CM | POA: Diagnosis not present

## 2022-02-12 NOTE — Assessment & Plan Note (Signed)
Suspect essential tremor.  She has a benign exam otherwise.  Discussed this did not seem consistent with Parkinson's.  She will monitor at this time.

## 2022-02-12 NOTE — Assessment & Plan Note (Signed)
Slightly elevated at times at home.  She will continue losartan 100 mg once daily.  She will return for nurse blood pressure check and bring her cuff in to ensure that it is accurate.  Orthostatics were negative.

## 2022-02-12 NOTE — Assessment & Plan Note (Addendum)
Physical exam completed.  Encouraged healthy diet and exercise.  Encouraged her to increase her exercise.  Mammogram is up-to-date.  Vaccines are up-to-date.  Lab work was reviewed with the patient.  Discussed her elevated triglycerides could have been related to this being a nonfasting test.  Discussed rechecking her bicarbonate level.  I encouraged her to drink more water given her mildly elevated BUN.  We will plan on a DEXA scan in April 2024 to follow-up after treatment with Prolia.

## 2022-02-12 NOTE — Patient Instructions (Signed)
Nice to see you. Please add in some additional exercise.  Please continue with a healthy diet and add in some vegetables.

## 2022-02-12 NOTE — Progress Notes (Signed)
Tommi Rumps, MD Phone: [phone]  SANII KUKLA is a 81 y.o. female who presents today for CPE.  Diet: mostly chicken and lean beef, plenty of fruits, could do better on vegetables, cut out evening snack, has milk and yogurt daily Exercise: silver sneakers 2x/week Colonoscopy: aged out Mammogram: UTD Family history-  Colon cancer: no  Breast cancer: both sides of her family  Ovarian cancer: no Menses: postmenopausal Vaccines-   Flu: UTD  Tetanus: UTD  Shingles: UTD  COVID19: UTD  Pneumonia: UTD Tobacco use: no Alcohol use: rare Illicit Drug use: no Dentist: yes Ophthalmology: yes  Hypertension: Patient notes BPs in the 109N systolic a lot of the time.  She notes an occasional sensation of just needing to stand there related to mild lightheadedness.  It happens randomly and not always when getting up.  She has not fainted.  No dizziness.  Notes this rarely happens.  She does take losartan 100 mg once daily.  Tremor: Patient notes tremor in her hands if she is holding something such as a mirror or setting something down.  She has no resting tremor.   Active Ambulatory Problems    Diagnosis Date Noted   Depression 07/05/2012   History of right breast cancer 07/05/2012   Medicare annual wellness visit, subsequent 05/09/2013   Osteoporosis 05/23/2014   Hyperlipidemia 05/23/2014   Obesity (BMI 30-39.9) 05/23/2014   Abnormal EKG 05/31/2014   Trigger finger, acquired 12/05/2015   Encounter for general adult medical examination with abnormal findings 12/27/2016   Tremor 12/27/2016   GERD (gastroesophageal reflux disease) 12/27/2016   Pre-diabetes 06/27/2017   Skin nodule 06/27/2017   Stress incontinence 12/31/2017   Hearing difficulty 12/31/2017   Changes in vision 12/31/2017   Rash 07/28/2018   Dysphagia 01/29/2019   Orthostatic lightheadedness 08/02/2019   Hypertension 11/03/2020   Resolved Ambulatory Problems    Diagnosis Date Noted   Hyperlipidemia LDL  goal < 100 07/05/2012   Osteoporosis 07/05/2012   Splitting of nail 05/09/2013   Belching 05/09/2013   Hematuria 05/09/2013   Trigger finger, acquired 05/09/2013   Abdominal pain, left lower quadrant 11/12/2013   Right shoulder pain 05/23/2014   Subacromial bursitis 06/06/2014   HLD (hyperlipidemia) 07/05/2012   Cerumen impaction 11/25/2014   Dysuria 11/25/2014   Elevated BP 12/05/2015   Dermatitis 12/05/2015   Cerumen impaction 06/27/2017   Past Medical History:  Diagnosis Date   Bilateral dry eyes    Breast cancer ([place]) 2000   Heart murmur    Personal history of radiation therapy 2000    Family History  Problem Relation Age of Onset   Cancer Mother    Breast cancer Mother 73   Heart disease Father    Heart attack Father 74   Cancer Maternal Aunt    Breast cancer Maternal Aunt        ? age   Breast cancer Paternal Aunt     Social History   Socioeconomic History   Marital status: Married    Spouse name: Not on file   Number of children: Not on file   Years of education: Not on file   Highest education level: Not on file  Occupational History   Not on file  Tobacco Use   Smoking status: Former    Years: 25.00    Types: Cigarettes    Quit date: 12/20/1993    Years since quitting: 28.1   Smokeless tobacco: Never  Vaping Use   Vaping Use: Never used  Substance  and Sexual Activity   Alcohol use: Yes    Comment: occasional   Drug use: No   Sexual activity: Not on file  Other Topics Concern   Not on file  Social History Narrative   Lives in [place] with husband.      Diet - healthy diet   Exercise - [employer] weight training   Social Determinants of Health   Financial Resource Strain: Low Risk    Difficulty of Paying Living Expenses: Not hard at all  Food Insecurity: No Food Insecurity   Worried About Charity fundraiser in the Last Year: Never true   Ran Out of Food in the Last Year: Never true  Transportation Needs: No Transportation Needs    Lack of Transportation (Medical): No   Lack of Transportation (Non-Medical): No  Physical Activity: Insufficiently Active   Days of Exercise per Week: 2 days   Minutes of Exercise per Session: 60 min  Stress: No Stress Concern Present   Feeling of Stress : Not at all  Social Connections: Unknown   Frequency of Communication with Friends and Family: More than three times a week   Frequency of Social Gatherings with Friends and Family: More than three times a week   Attends Religious Services: More than 4 times per year   Active Member of [employer] or Organizations: Not on file   Attends Archivist Meetings: Not on file   Marital Status: Married  Human resources officer Violence: Not At Risk   Fear of Current or Ex-Partner: No   Emotionally Abused: No   Physically Abused: No   Sexually Abused: No    ROS  General:  Negative for nexplained weight loss, fever Skin: Negative for new or changing mole, sore that won't heal HEENT: Negative for trouble hearing, trouble seeing, ringing in ears, mouth sores, hoarseness, change in voice, dysphagia. CV:  Negative for chest pain, dyspnea, edema, palpitations Resp: Negative for cough, dyspnea, hemoptysis GI: Negative for nausea, vomiting, diarrhea, constipation, abdominal pain, melena, hematochezia. GU: Negative for dysuria, incontinence, urinary hesitance, hematuria, vaginal or penile discharge, polyuria, sexual difficulty, lumps in testicle or breasts MSK: Negative for muscle cramps or aches, joint pain or swelling Neuro: Negative for headaches, weakness, numbness, dizziness, passing out/fainting Psych: Negative for depression, anxiety, memory problems  Objective  Physical Exam Vitals:   02/12/22 1322  BP: 130/78  Pulse: 79  Temp: 98 F (36.7 C)  SpO2: 98%   Lying blood pressure 145/78 pulse 75 Sitting blood pressure 147/88 pulse 76 Standing blood pressure 157/84 pulse 79  BP Readings from Last 3 Encounters:  02/12/22 130/78   05/12/21 122/60  02/20/21 (!) 147/79   Wt Readings from Last 3 Encounters:  02/12/22 164 lb 12.8 oz (74.8 kg)  11/19/21 164 lb (74.4 kg)  05/12/21 164 lb (74.4 kg)    Physical Exam Constitutional:      General: She is not in acute distress.    Appearance: She is not diaphoretic.  HENT:     Head: Normocephalic and atraumatic.  Cardiovascular:     Rate and Rhythm: Normal rate and regular rhythm.     Heart sounds: Normal heart sounds.  Pulmonary:     Effort: Pulmonary effort is normal.     Breath sounds: Normal breath sounds.  Abdominal:     General: Bowel sounds are normal. There is no distension.     Palpations: Abdomen is soft.     Tenderness: There is no abdominal tenderness.  Musculoskeletal:  Right lower leg: No edema.     Left lower leg: No edema.  Lymphadenopathy:     Cervical: No cervical adenopathy.  Skin:    General: Skin is warm and dry.  Neurological:     Mental Status: She is alert.     Comments: EOMI, PERRL, opens and closes eyes adequately, sensation to light touch intact V1 through V3 bilaterally, hearing intact to finger rub, shoulder shrug intact, 5/5 strength in bilateral biceps, triceps, grip, quads, hamstrings, plantar and dorsiflexion, sensation to light touch intact in bilateral UE and LE, normal gait, mild tremor with holding a clipboard, no cogwheel rigidity in her arms  Psychiatric:        Mood and Affect: Mood normal.     Assessment/Plan:   Problem List Items Addressed This Visit     Encounter for general adult medical examination with abnormal findings    Physical exam completed.  Encouraged healthy diet and exercise.  Encouraged her to increase her exercise.  Mammogram is up-to-date.  Vaccines are up-to-date.  Lab work was reviewed with the patient.  Discussed her elevated triglycerides could have been related to this being a nonfasting test.  Discussed rechecking her bicarbonate level.  I encouraged her to drink more water given her mildly  elevated BUN.  We will plan on a DEXA scan in April 2024 to follow-up after treatment with Prolia.      Hypertension    Slightly elevated at times at home.  She will continue losartan 100 mg once daily.  She will return for nurse blood pressure check and bring her cuff in to ensure that it is accurate.  Orthostatics were negative.      Tremor    Suspect essential tremor.  She has a benign exam otherwise.  Discussed this did not seem consistent with Parkinson's.  She will monitor at this time.       Return in about 1 week (around 02/19/2022) for Nurse BP check comparing her cuff, 6 months PCP for hypertension.  This visit occurred during the SARS-CoV-2 public health emergency.  Safety protocols were in place, including screening questions prior to the visit, additional usage of staff PPE, and extensive cleaning of exam room while observing appropriate contact time as indicated for disinfecting solutions.    Tommi Rumps, MD [place]

## 2022-02-19 ENCOUNTER — Other Ambulatory Visit: Payer: Self-pay

## 2022-02-19 ENCOUNTER — Ambulatory Visit: Payer: Medicare Other | Admitting: *Deleted

## 2022-02-19 VITALS — BP 142/82 | HR 80

## 2022-02-19 DIAGNOSIS — I10 Essential (primary) hypertension: Secondary | ICD-10-CM

## 2022-02-19 NOTE — Progress Notes (Signed)
Patient here for nurse visit BP check per order from [place] [facility] 02/12/22.  ? ?Patient reports compliance with prescribed BP medications: yes, Losartan 100 mg po once daily ? ?Last dose of BP medication: Last night around @11 :30pm ? ?BP Readings from Last 3 Encounters:  ?02/19/22 (!) 142/82  ?02/12/22 130/78  ?05/12/21 122/60  ? ?Pulse Readings from Last 3 Encounters:  ?02/19/22 80  ?02/12/22 79  ?05/12/21 78  ?Rechecked BP 10 mins post after initial reading. Readings as followed: ?R:130/80, PR:73  O2:97 ? ?Pt also brought in her machine, readings as followed: ?L:135/73 R:140/73 ?PR:65  PR:70  ? ?Patient verbalized understanding of instructions.  ? ?Everrett Coombe, [place]  ?

## 2022-02-22 ENCOUNTER — Ambulatory Visit: Payer: Medicare Other

## 2022-03-05 ENCOUNTER — Other Ambulatory Visit: Payer: Self-pay | Admitting: Family Medicine

## 2022-03-05 DIAGNOSIS — I10 Essential (primary) hypertension: Secondary | ICD-10-CM

## 2022-05-07 ENCOUNTER — Other Ambulatory Visit: Payer: Self-pay | Admitting: Family Medicine

## 2022-05-07 DIAGNOSIS — Z1231 Encounter for screening mammogram for malignant neoplasm of breast: Secondary | ICD-10-CM

## 2022-06-08 ENCOUNTER — Telehealth: Payer: Self-pay | Admitting: *Deleted

## 2022-06-08 ENCOUNTER — Encounter: Payer: Self-pay | Admitting: *Deleted

## 2022-06-08 NOTE — Telephone Encounter (Signed)
Authorization Status Approved Authorization Number [id number]  Authorization Start Date 06-08-2022 Authorization End Date 06-09-2023  [id number] Injection Denosumab  60 mg  once every 180 days 2 doses

## 2022-06-08 NOTE — Telephone Encounter (Signed)
LMTCB & sent mychart message to go over Prolia benefits.

## 2022-06-08 NOTE — Telephone Encounter (Signed)
Pt will be responsible for $99 at the time of her Prolia injection on 07/08/22. PA required & initiated.

## 2022-06-09 ENCOUNTER — Encounter: Payer: Self-pay | Admitting: *Deleted

## 2022-06-09 NOTE — Telephone Encounter (Signed)
Mailed Letter

## 2022-06-14 ENCOUNTER — Ambulatory Visit
Admission: RE | Admit: 2022-06-14 | Discharge: 2022-06-14 | Disposition: A | Discharge status: Home / Self Care | Payer: Medicare Other | Source: Ambulatory Visit | Attending: Family Medicine | Admitting: Family Medicine

## 2022-06-14 DIAGNOSIS — Z1231 Encounter for screening mammogram for malignant neoplasm of breast: Secondary | ICD-10-CM | POA: Diagnosis present

## 2022-07-08 ENCOUNTER — Ambulatory Visit (INDEPENDENT_AMBULATORY_CARE_PROVIDER_SITE_OTHER): Payer: Medicare Other

## 2022-07-08 DIAGNOSIS — M810 Age-related osteoporosis without current pathological fracture: Secondary | ICD-10-CM | POA: Diagnosis not present

## 2022-07-08 DIAGNOSIS — R5383 Other fatigue: Secondary | ICD-10-CM

## 2022-07-08 MED ORDER — DENOSUMAB 60 MG/ML ~~LOC~~ SOSY
60.0000 mg | PREFILLED_SYRINGE | Freq: Once | SUBCUTANEOUS | Status: AC
  Administered 2022-07-08: 60 mg via SUBCUTANEOUS

## 2022-07-08 NOTE — Progress Notes (Signed)
Patient presented for Prolia injection to right deltoid, patient voiced no concerns nor showed any signs of distress during injection

## 2022-08-16 ENCOUNTER — Ambulatory Visit (INDEPENDENT_AMBULATORY_CARE_PROVIDER_SITE_OTHER): Payer: Medicare Other | Admitting: Family Medicine

## 2022-08-16 ENCOUNTER — Encounter: Payer: Self-pay | Admitting: Family Medicine

## 2022-08-16 VITALS — BP 138/80 | HR 80 | Temp 98.5°F | Ht 62.0 in | Wt 161.8 lb

## 2022-08-16 DIAGNOSIS — F325 Major depressive disorder, single episode, in full remission: Secondary | ICD-10-CM

## 2022-08-16 DIAGNOSIS — R7303 Prediabetes: Secondary | ICD-10-CM | POA: Diagnosis not present

## 2022-08-16 DIAGNOSIS — R42 Dizziness and giddiness: Secondary | ICD-10-CM | POA: Diagnosis not present

## 2022-08-16 DIAGNOSIS — G479 Sleep disorder, unspecified: Secondary | ICD-10-CM | POA: Diagnosis not present

## 2022-08-16 DIAGNOSIS — I10 Essential (primary) hypertension: Secondary | ICD-10-CM

## 2022-08-16 DIAGNOSIS — H6121 Impacted cerumen, right ear: Secondary | ICD-10-CM

## 2022-08-16 HISTORY — DX: Dizziness and giddiness: R42

## 2022-08-16 LAB — TSH: TSH: 1.16 u[IU]/mL (ref 0.35–5.50)

## 2022-08-16 LAB — CBC
HCT: 42 % (ref 36.0–46.0)
Hemoglobin: 14 g/dL (ref 12.0–15.0)
MCHC: 33.3 g/dL (ref 30.0–36.0)
MCV: 87.9 fl (ref 78.0–100.0)
Platelets: 230 10*3/uL (ref 150.0–400.0)
RBC: 4.78 Mil/uL (ref 3.87–5.11)
RDW: 14.4 % (ref 11.5–15.5)
WBC: 7.4 10*3/uL (ref 4.0–10.5)

## 2022-08-16 LAB — COMPREHENSIVE METABOLIC PANEL
ALT: 22 U/L (ref 0–35)
AST: 25 U/L (ref 0–37)
Albumin: 4.3 g/dL (ref 3.5–5.2)
Alkaline Phosphatase: 52 U/L (ref 39–117)
BUN: 17 mg/dL (ref 6–23)
CO2: 28 mEq/L (ref 19–32)
Calcium: 9.4 mg/dL (ref 8.4–10.5)
Chloride: 104 mEq/L (ref 96–112)
Creatinine, Ser: 0.71 mg/dL (ref 0.40–1.20)
GFR: 79.87 mL/min (ref >60.00)
Glucose, Bld: 90 mg/dL (ref 70–99)
Potassium: 4.4 mEq/L (ref 3.5–5.1)
Sodium: 139 mEq/L (ref 135–145)
Total Bilirubin: 0.6 mg/dL (ref 0.2–1.2)
Total Protein: 6.8 g/dL (ref 6.0–8.3)

## 2022-08-16 LAB — HEMOGLOBIN A1C: Hgb A1c MFr Bld: 6.1 % (ref 4.6–6.5)

## 2022-08-16 NOTE — Assessment & Plan Note (Signed)
Undetermined cause.  This could be disequilibrium.  It does not seem to be orthostasis given her description.  It does not seem to be vertigo given her description.  We will check lab work.  If lab work is unrevealing of a cause then we could consider imaging.

## 2022-08-16 NOTE — Assessment & Plan Note (Signed)
Right-sided.  Irrigated and tolerated well revealing a normal TM.  Discussed use of Debrox periodically to prevent this from recurring.

## 2022-08-16 NOTE — Progress Notes (Signed)
Stephanie Rumps, MD Phone: [phone]  Stephanie Mora is a 81 y.o. female who presents today for f/u.  HYPERTENSION Disease Monitoring Home BP Monitoring average systolic 664 Chest pain- no    Dyspnea- no Medications Compliance-  taking losartan. Lightheadedness-  see below  Edema- no BMET    Component Value Date/Time   NA 142 02/10/2022 1431   K 4.5 02/10/2022 1431   CL 98 02/10/2022 1431   CO2 35 (H) 02/10/2022 1431   GLUCOSE 80 02/10/2022 1431   BUN 24 (H) 02/10/2022 1431   CREATININE 0.86 02/10/2022 1431   CALCIUM 9.8 02/10/2022 1431   Lightheadedness: Patient reports a sensation of lightheadedness that occurs intermittently over the last 6 months.  It occurs 1-3 times a week.  Does not occur when she gets up and down.  She has not had any syncopal episodes.  She notes it resolves within seconds typically.  Has been increasing in frequency recently.  There are no vertiginous symptoms.  No tinnitus.  No ear fullness.  No numbness, weakness, or vision changes.  She has never had this occur when she is exercising.  She has not started any new medications or supplements.  Sleeping difficulty: Patient notes for the last 6 months she wakes up frequently during the night.  Occasionally she will have a hard time going to sleep.  She goes to bed around midnight and wakes up around 8 AM.  She does have some snacks near bedtime.  She rarely has alcohol.  Her caffeine intake is in the morning.  She does look at screens before bed.  She notes no anxiety or depression.  Social History   Tobacco Use  Smoking Status Former   Years: 25.00   Types: Cigarettes   Quit date: 12/20/1993   Years since quitting: 28.6  Smokeless Tobacco Never    Current Outpatient Medications on File Prior to Visit  Medication Sig Dispense Refill   Acetylcysteine (N-ACETYL-L-CYSTEINE PO) Take 600 mcg by mouth 2 (two) times daily.     CALTRATE 600+D3 SOFT 600-20 MG-MCG CHEW      Cholecalciferol (VITAMIN D3)  2000 UNITS TABS Take 1 tablet by mouth daily.     Coenzyme Q10 (COQ10) 100 MG CAPS Take 1 capsule by mouth daily.     Emollient (COLLAGEN EX) Apply topically.     fluticasone (FLONASE) 50 MCG/ACT nasal spray Place 2 sprays into the nose as needed.     losartan (COZAAR) 100 MG tablet TAKE 1 TABLET(100 MG) BY MOUTH DAILY 90 tablet 1   MISC NATURAL PRODUCT OP Take by mouth. Tumeric 54m     Multiple Vitamin (MULTIVITAMIN) tablet Take 1 tablet by mouth daily.     Polyethyl Glycol-Propyl Glycol (SYSTANE) 0.4-0.3 % SOLN Apply to eye.     polyethylene glycol-electrolytes (NULYTELY) 420 g solution Take by mouth.     PROLIA 60 MG/ML SOSY injection      simvastatin (ZOCOR) 20 MG tablet TAKE 1 TABLET(20 MG) BY MOUTH EVERY EVENING 90 tablet 3   venlafaxine XR (EFFEXOR-XR) 150 MG 24 hr capsule TAKE 1 CAPSULE BY MOUTH EVERY DAY 90 capsule 3   vitamin C (ASCORBIC ACID) 500 MG tablet Take 500 mg by mouth 2 (two) times daily.     No current facility-administered medications on file prior to visit.     ROS see history of present illness  Objective  Physical Exam Vitals:   08/16/22 0926  BP: 138/80  Pulse: 80  Temp: 98.5 F (36.9 C)  SpO2: 97%    BP Readings from Last 3 Encounters:  08/16/22 138/80  02/19/22 (!) 142/82  02/12/22 130/78   Wt Readings from Last 3 Encounters:  08/16/22 161 lb 12.8 oz (73.4 kg)  02/12/22 164 lb 12.8 oz (74.8 kg)  11/19/21 164 lb (74.4 kg)    Physical Exam Constitutional:      General: She is not in acute distress.    Appearance: She is not diaphoretic.  HENT:     Left Ear: Tympanic membrane normal.     Ears:     Comments: Right TM obscured by cerumen, irrigated by CMA revealing normal TM, tolerated well by the patient Cardiovascular:     Rate and Rhythm: Normal rate and regular rhythm.     Heart sounds: Normal heart sounds.  Pulmonary:     Effort: Pulmonary effort is normal.     Breath sounds: Normal breath sounds.  Skin:    General: Skin is warm  and dry.  Neurological:     Mental Status: She is alert.     Comments: CN 3-12 intact, 5/5 strength in bilateral biceps, triceps, grip, quads, hamstrings, plantar and dorsiflexion, sensation to light touch intact in bilateral UE and LE, normal gait, normal rapid alternating movements, normal finger-to-nose, negative Romberg, no pronator drift      Assessment/Plan: Please see individual problem list.  Problem List Items Addressed This Visit     Depression (Chronic)    Adequately controlled.  Continue Effexor 150 mg daily.      Hypertension - Primary (Chronic)    Discussed her blood pressure is adequately controlled for age.  She will start to monitor it on a daily basis and send me readings in a few weeks.  She will continue losartan 100 mg daily.      Relevant Orders   Comp Met (CMET)   Lightheadedness (Chronic)    Undetermined cause.  This could be disequilibrium.  It does not seem to be orthostasis given her description.  It does not seem to be vertigo given her description.  We will check lab work.  If lab work is unrevealing of a cause then we could consider imaging.      Relevant Orders   Comp Met (CMET)   TSH   CBC   Sleeping difficulty (Chronic)    Possibly related to watching TV versus having food late in the evening.  Discussed eliminating screen time and eliminating snacking before bed.  If this is not beneficial she will let us know and we could consider something like trazodone.  We did discuss the potential of trying melatonin 2 mg nightly 30 minutes before bed on a short-term as needed basis.      Cerumen impaction    Right-sided.  Irrigated and tolerated well revealing a normal TM.  Discussed use of Debrox periodically to prevent this from recurring.      Pre-diabetes   Relevant Orders   HgB A1c     Return in about 6 months (around 02/16/2023).   Stephanie Rumps, MD [place]

## 2022-08-16 NOTE — Assessment & Plan Note (Addendum)
Possibly related to watching TV versus having food late in the evening.  Discussed eliminating screen time and eliminating snacking before bed.  If this is not beneficial she will let us know and we could consider something like trazodone.  We did discuss the potential of trying melatonin 2 mg nightly 30 minutes before bed on a short-term as needed basis.

## 2022-08-16 NOTE — Patient Instructions (Signed)
Nice to see you. We will contact you with your lab results. Please try to monitor your blood pressure once daily and send me your readings in 2 weeks. Please try to eliminate screens the hour before bed and snacking after dinner. You could try melatonin 2 mg nightly as needed 30 minutes prior to bedtime on a short-term basis.

## 2022-08-16 NOTE — Assessment & Plan Note (Signed)
Discussed her blood pressure is adequately controlled for age.  She will start to monitor it on a daily basis and send me readings in a few weeks.  She will continue losartan 100 mg daily.

## 2022-08-16 NOTE — Assessment & Plan Note (Signed)
Adequately controlled.  Continue Effexor 150 mg daily.

## 2022-08-16 NOTE — Progress Notes (Signed)
fol

## 2022-09-13 ENCOUNTER — Encounter: Payer: Self-pay | Admitting: Family Medicine

## 2022-09-13 DIAGNOSIS — R42 Dizziness and giddiness: Secondary | ICD-10-CM

## 2022-09-20 ENCOUNTER — Other Ambulatory Visit: Payer: Self-pay | Admitting: Family Medicine

## 2022-09-20 DIAGNOSIS — I10 Essential (primary) hypertension: Secondary | ICD-10-CM

## 2022-09-21 ENCOUNTER — Ambulatory Visit
Admission: RE | Admit: 2022-09-21 | Discharge: 2022-09-21 | Disposition: A | Discharge status: Home / Self Care | Payer: Medicare Other | Source: Ambulatory Visit | Attending: Family Medicine | Admitting: Family Medicine

## 2022-09-21 DIAGNOSIS — R42 Dizziness and giddiness: Secondary | ICD-10-CM | POA: Insufficient documentation

## 2022-09-21 MED ORDER — GADOPICLENOL 0.5 MMOL/ML IV SOLN
7.0000 mL | Freq: Once | INTRAVENOUS | Status: AC | PRN
Start: 2022-09-21 — End: 2022-09-21
  Administered 2022-09-21: 7 mL via INTRAVENOUS

## 2022-09-24 ENCOUNTER — Encounter: Payer: Self-pay | Admitting: Family Medicine

## 2022-10-07 ENCOUNTER — Encounter: Payer: Self-pay | Admitting: Family Medicine

## 2022-10-11 ENCOUNTER — Encounter: Payer: Self-pay | Admitting: Family Medicine

## 2022-10-11 ENCOUNTER — Ambulatory Visit (INDEPENDENT_AMBULATORY_CARE_PROVIDER_SITE_OTHER): Payer: Medicare Other | Admitting: Family Medicine

## 2022-10-11 VITALS — BP 130/70 | HR 94 | Temp 99.3°F | Ht 62.0 in | Wt 164.8 lb

## 2022-10-11 DIAGNOSIS — M25551 Pain in right hip: Secondary | ICD-10-CM

## 2022-10-11 NOTE — Progress Notes (Signed)
Tommi Rumps, MD Phone: [phone]  ADRIEL DESROSIER is a 81 y.o. female who presents today for same day visit.   Right hip pain: This has been going on a couple of months.  She was at an exercise class and swung her leg out to the right side and notes after that her right lateral hip had started to bother her.  It has progressively improved.  It hurts more when she goes up steps.  Notes discomfort is at the side of her hip.  She is on Prolia.  She was on Reclast ages ago.  Social History   Tobacco Use  Smoking Status Former   Years: 25.00   Types: Cigarettes   Quit date: 12/20/1993   Years since quitting: 28.8  Smokeless Tobacco Never    Current Outpatient Medications on File Prior to Visit  Medication Sig Dispense Refill   Acetylcysteine (N-ACETYL-L-CYSTEINE PO) Take 600 mcg by mouth 2 (two) times daily.     CALTRATE 600+D3 SOFT 600-20 MG-MCG CHEW      Cholecalciferol (VITAMIN D3) 2000 UNITS TABS Take 1 tablet by mouth daily.     Coenzyme Q10 (COQ10) 100 MG CAPS Take 1 capsule by mouth daily.     Emollient (COLLAGEN EX) Apply topically.     fluticasone (FLONASE) 50 MCG/ACT nasal spray Place 2 sprays into the nose as needed.     losartan (COZAAR) 100 MG tablet TAKE 1 TABLET(100 MG) BY MOUTH DAILY 90 tablet 1   MISC NATURAL PRODUCT OP Take by mouth. Tumeric '500mg'$      Multiple Vitamin (MULTIVITAMIN) tablet Take 1 tablet by mouth daily.     Polyethyl Glycol-Propyl Glycol (SYSTANE) 0.4-0.3 % SOLN Apply to eye.     polyethylene glycol-electrolytes (NULYTELY) 420 g solution Take by mouth.     PROLIA 60 MG/ML SOSY injection      simvastatin (ZOCOR) 20 MG tablet TAKE 1 TABLET(20 MG) BY MOUTH EVERY EVENING 90 tablet 3   venlafaxine XR (EFFEXOR-XR) 150 MG 24 hr capsule TAKE 1 CAPSULE BY MOUTH EVERY DAY 90 capsule 3   vitamin C (ASCORBIC ACID) 500 MG tablet Take 500 mg by mouth 2 (two) times daily.     No current facility-administered medications on file prior to visit.     ROS see  history of present illness  Objective  Physical Exam Vitals:   10/11/22 1650  BP: 130/70  Pulse: 94  Temp: 99.3 F (37.4 C)  SpO2: 96%    BP Readings from Last 3 Encounters:  10/11/22 130/70  08/16/22 138/80  02/19/22 (!) 142/82   Wt Readings from Last 3 Encounters:  10/11/22 164 lb 12.8 oz (74.8 kg)  08/16/22 161 lb 12.8 oz (73.4 kg)  02/12/22 164 lb 12.8 oz (74.8 kg)    Physical Exam Musculoskeletal:     Comments: Good internal and external range of motion bilateral hips with no discomfort, she is tender over her right greater trochanter      Assessment/Plan: Please see individual problem list.  Problem List Items Addressed This Visit     Right hip pain - Primary    Suspect muscular strain given that its been progressively improving.  Given that she has continued to have some discomfort and that she is on Prolia we will get an x-ray of the hip.  Discussed the atypical femur fractures typically occur when people have have been on medicine for osteoporosis for greater than 5 years and she has only been on this for about a year.  Relevant Orders   DG HIP UNILAT WITH PELVIS 2-3 VIEWS RIGHT     Return in about 1 day (around 10/12/2022) for hip x-ray.   Tommi Rumps, MD [place]

## 2022-10-12 DIAGNOSIS — M25551 Pain in right hip: Secondary | ICD-10-CM | POA: Insufficient documentation

## 2022-10-12 NOTE — Assessment & Plan Note (Signed)
Suspect muscular strain given that its been progressively improving.  Given that she has continued to have some discomfort and that she is on Prolia we will get an x-ray of the hip.  Discussed the atypical femur fractures typically occur when people have have been on medicine for osteoporosis for greater than 5 years and she has only been on this for about a year.

## 2022-10-13 ENCOUNTER — Ambulatory Visit (INDEPENDENT_AMBULATORY_CARE_PROVIDER_SITE_OTHER): Payer: Medicare Other

## 2022-10-13 ENCOUNTER — Other Ambulatory Visit: Payer: Medicare Other

## 2022-10-13 DIAGNOSIS — M25551 Pain in right hip: Secondary | ICD-10-CM | POA: Diagnosis not present

## 2022-10-17 ENCOUNTER — Encounter: Payer: Self-pay | Admitting: Family Medicine

## 2022-10-17 DIAGNOSIS — M7060 Trochanteric bursitis, unspecified hip: Secondary | ICD-10-CM

## 2022-11-01 ENCOUNTER — Encounter: Payer: Self-pay | Admitting: Family Medicine

## 2022-11-01 ENCOUNTER — Telehealth: Payer: Self-pay | Admitting: Family Medicine

## 2022-11-01 NOTE — Telephone Encounter (Signed)
Lft pt vm to call ofc . thanks 

## 2022-11-04 ENCOUNTER — Encounter: Payer: Self-pay | Admitting: Family Medicine

## 2022-11-18 ENCOUNTER — Telehealth: Payer: Self-pay | Admitting: Family Medicine

## 2022-11-18 NOTE — Telephone Encounter (Signed)
Copied from [place] [phone]. Topic: Medicare AWV >> Nov 18, 2022 10:43 AM Devoria Glassing wrote: Reason for CRM: Left message for patient to schedule Annual Wellness Visit.  Please schedule with Nurse Health Advisor Denisa O'Brien-Blaney, LPN at [facility]. This appt can be telephone or office visit.  Please call [phone] ask for [facility]

## 2022-11-22 ENCOUNTER — Telehealth: Payer: Self-pay | Admitting: Family Medicine

## 2022-11-22 NOTE — Telephone Encounter (Signed)
Copied from [place] [id number]. Topic: Medicare AWV >> Nov 22, 2022 11:54 AM Devoria Glassing wrote: Reason for CRM: Left message for patient to schedule Annual Wellness Visit.  Please schedule with Nurse Health Advisor Denisa O'Brien-Blaney, LPN at [facility]. This appt can be telephone or office visit.  Please call [phone] ask for [facility]

## 2022-11-23 ENCOUNTER — Other Ambulatory Visit: Payer: Self-pay | Admitting: Family Medicine

## 2022-11-24 ENCOUNTER — Ambulatory Visit (INDEPENDENT_AMBULATORY_CARE_PROVIDER_SITE_OTHER): Payer: Medicare Other

## 2022-11-24 VITALS — Ht 62.0 in | Wt 164.0 lb

## 2022-11-24 DIAGNOSIS — Z0000 Encounter for general adult medical examination without abnormal findings: Secondary | ICD-10-CM

## 2022-11-24 NOTE — Progress Notes (Signed)
Subjective:   Stephanie Mora is a 81 y.o. female who presents for Medicare Annual (Subsequent) preventive examination.  Review of Systems    No ROS.  Medicare Wellness Virtual Visit.  Visual/audio telehealth visit, UTA vital signs.   See social history for additional risk factors.   Cardiac Risk Factors include: advanced age (>89mn, >>70women)     Objective:    Today's Vitals   11/24/22 1512  Weight: 164 lb (74.4 kg)  Height: '5\' 2"'$  (1.575 m)   Body mass index is 30 kg/m.     11/24/2022    3:16 PM 11/19/2021    3:14 PM 11/18/2020    2:04 PM 01/08/2020   12:19 PM 01/08/2020   11:54 AM 11/07/2019    4:04 PM 06/13/2019   12:05 PM  Advanced Directives  Does Patient Have a Medical Advance Directive? Yes Yes Yes No Yes Yes Yes  Type of AParamedicof [place]Living will [place]Living will [place]Living will   [place] will [place]Living will  Does patient want to make changes to medical advance directive? No - Patient declined No - Patient declined No - Patient declined No - Patient declined  No - Patient declined No - Patient declined  Copy of H[place]in Chart? No - copy requested No - copy requested No - copy requested   No - copy requested No - copy requested  Would patient like information on creating a medical advance directive?    No - Patient declined       Current Medications (verified) Outpatient Encounter Medications as of 11/24/2022  Medication Sig   Acetylcysteine (N-ACETYL-L-CYSTEINE PO) Take 600 mcg by mouth 2 (two) times daily.   CALTRATE 600+D3 SOFT 600-20 MG-MCG CHEW    Cholecalciferol (VITAMIN D3) 2000 UNITS TABS Take 1 tablet by mouth daily.   Coenzyme Q10 (COQ10) 100 MG CAPS Take 1 capsule by mouth daily.   Emollient (COLLAGEN EX) Apply topically.   fluticasone (FLONASE) 50 MCG/ACT nasal spray Place 2 sprays into the nose  as needed.   losartan (COZAAR) 100 MG tablet TAKE 1 TABLET(100 MG) BY MOUTH DAILY   MISC NATURAL PRODUCT OP Take by mouth. Tumeric '500mg'$    Multiple Vitamin (MULTIVITAMIN) tablet Take 1 tablet by mouth daily.   Polyethyl Glycol-Propyl Glycol (SYSTANE) 0.4-0.3 % SOLN Apply to eye.   polyethylene glycol-electrolytes (NULYTELY) 420 g solution Take by mouth.   PROLIA 60 MG/ML SOSY injection    simvastatin (ZOCOR) 20 MG tablet TAKE 1 TABLET(20 MG) BY MOUTH EVERY EVENING   venlafaxine XR (EFFEXOR-XR) 150 MG 24 hr capsule TAKE 1 CAPSULE BY MOUTH EVERY DAY   vitamin C (ASCORBIC ACID) 500 MG tablet Take 500 mg by mouth 2 (two) times daily.   No facility-administered encounter medications on file as of 11/24/2022.    Allergies (verified) Patient has no known allergies.   History: Past Medical History:  Diagnosis Date   Bilateral dry eyes    [place]   [facility] INVASIVE WELL DIFFERENTIATED DUCTAL ADENOCARCINOMA, TUBULAR TYPE.   Heart murmur    as child   Hyperlipidemia    Osteoporosis    reclast treatment in past   Personal history of radiation therapy 2000   F/U right breast cancer   Past Surgical History:  Procedure Laterality Date   BREAST BIOPSY Right 2000   +  BREAST BIOPSY Left 1980's   neg, Dr Bary Castilla   BREAST LUMPECTOMY Right 2000   at [place] Right 05/23/2019   Procedure: CATARACT EXTRACTION PHACO AND INTRAOCULAR LENS PLACEMENT ([place])  RIGHT TORIC LENS;  Surgeon: Leandrew Koyanagi, MD;  Location: [facility];  Service: Ophthalmology;  Laterality: Right;   CATARACT EXTRACTION W/PHACO Left 06/13/2019   Procedure: CATARACT EXTRACTION PHACO AND INTRAOCULAR LENS PLACEMENT ([place]) LEFT TORIC LENS;  Surgeon: Leandrew Koyanagi, MD;  Location: [facility];  Service: Ophthalmology;  Laterality: Left;   Family History  Problem Relation Age of Onset   Cancer Mother    Breast  cancer Mother 33   Heart disease Father    Heart attack Father 38   Cancer Maternal Aunt    Breast cancer Maternal Aunt        ? age   Breast cancer Paternal Aunt    Social History   Socioeconomic History   Marital status: Married    Spouse name: Not on file   Number of children: Not on file   Years of education: Not on file   Highest education level: Not on file  Occupational History   Not on file  Tobacco Use   Smoking status: Former    Years: 25.00    Types: Cigarettes    Quit date: 12/20/1993    Years since quitting: 28.9   Smokeless tobacco: Never  Vaping Use   Vaping Use: Never used  Substance and Sexual Activity   Alcohol use: Yes    Comment: occasional   Drug use: No   Sexual activity: Not on file  Other Topics Concern   Not on file  Social History Narrative   Lives in [place] with husband.      Diet - healthy diet   Exercise - [employer] weight training   Social Determinants of Health   Financial Resource Strain: Low Risk  (11/24/2022)   Overall Financial Resource Strain (CARDIA)    Difficulty of Paying Living Expenses: Not hard at all  Food Insecurity: No Food Insecurity (11/24/2022)   Hunger Vital Sign    Worried About Running Out of Food in the Last Year: Never true    Ran Out of Food in the Last Year: Never true  Transportation Needs: No Transportation Needs (11/24/2022)   PRAPARE - Hydrologist (Medical): No    Lack of Transportation (Non-Medical): No  Physical Activity: Insufficiently Active (11/24/2022)   Exercise Vital Sign    Days of Exercise per Week: 2 days    Minutes of Exercise per Session: 60 min  Stress: No Stress Concern Present (11/24/2022)   [place]    Feeling of Stress : Not at all  Social Connections: Unknown (11/24/2022)   Social Connection and Isolation Panel [NHANES]    Frequency of Communication with Friends and Family: More than  three times a week    Frequency of Social Gatherings with Friends and Family: More than three times a week    Attends Religious Services: More than 4 times per year    Active Member of [employer] or Organizations: Not on file    Attends Archivist Meetings: Not on file    Marital Status: Married    Tobacco Counseling Counseling given: Not Answered   Clinical Intake:  Pre-visit preparation completed: Yes        Diabetes: No  How often do you need  to have someone help you when you read instructions, pamphlets, or other written materials from your doctor or pharmacy?: 1 - Never    Interpreter Needed?: No    Activities of Daily Living    11/24/2022    3:18 PM  In your present state of health, do you have any difficulty performing the following activities:  Hearing? 0  Vision? 0  Difficulty concentrating or making decisions? 0  Walking or climbing stairs? 0  Dressing or bathing? 0  Doing errands, shopping? 0  Preparing Food and eating ? N  Using the Toilet? N  In the past six months, have you accidently leaked urine? N  Do you have problems with loss of bowel control? N  Managing your Medications? N  Managing your Finances? N  Housekeeping or managing your Housekeeping? N    Patient Care Team: Leone Haven, MD as PCP - General (Family Medicine)  Indicate any recent Medical Services you may have received from other than [facility] providers in the past year (date may be approximate).     Assessment:   This is a routine wellness examination for [place].  I connected with  Roseanne Kaufman on 11/24/22 by a audio enabled telemedicine application and verified that I am speaking with the correct person using two identifiers.  Patient Location: Home  Provider Location: Office/Clinic  I discussed the limitations of evaluation and management by telemedicine. The patient expressed understanding and agreed to proceed.   Hearing/Vision screen Hearing Screening -  Comments:: Patient is able to hear conversational tones without difficulty. No issues reported. Vision Screening - Comments:: Followed by [facility], Dr. Wallace Going Cataract extracted, bilateral Wears corrective lenses  They have seen their ophthalmologist in the last 12 months.    Dietary issues and exercise activities discussed: Current Exercise Habits: Home exercise routine, Time (Minutes): 45, Frequency (Times/Week): 2, Weekly Exercise (Minutes/Week): 90, Intensity: Mild Regular diet   Goals Addressed               This Visit's Progress     Patient Stated     Healthy lifestyle (pt-stated)        Increase physical activity. I would like to lose a little weight. Monitor carb intake.       Depression Screen    11/24/2022    3:15 PM 08/16/2022    9:40 AM 02/12/2022    1:24 PM 11/19/2021    3:10 PM 05/12/2021    8:07 AM 11/18/2020    1:59 PM 11/03/2020    2:35 PM  PHQ 2/9 Scores  PHQ - 2 Score 0 0 0 0 0 0 0    Fall Risk    11/24/2022    3:17 PM 08/16/2022    9:40 AM 02/12/2022    1:24 PM 11/19/2021    3:16 PM 05/12/2021    8:07 AM  [place] in the past year? 0 0 0 0 0  Number falls in past yr: 0 0 0 0 0  Injury with Fall? 0 0 0 0   Risk for fall due to :  No Fall Risks No Fall Risks    Follow up Falls evaluation completed;Falls prevention discussed Falls evaluation completed Falls evaluation completed Falls evaluation completed Falls evaluation completed    FALL RISK PREVENTION PERTAINING TO THE HOME: Home free of loose throw rugs in walkways, pet beds, electrical cords, etc? Yes  Adequate lighting in your home to reduce risk of falls? Yes  ASSISTIVE DEVICES UTILIZED TO PREVENT FALLS: Life alert? No  Use of a cane, walker or w/c? No  Grab bars in the bathroom? No  Shower chair or bench in shower? No  Elevated toilet seat or a handicapped toilet? No   TIMED UP AND GO: Was the test performed? No .   Cognitive Function:        11/24/2022     3:20 PM 11/07/2019    3:56 PM  6CIT Screen  What Year? 0 points 0 points  What month? 0 points 0 points  What time?  0 points  Count back from 20  0 points  Months in reverse 0 points 0 points  Repeat phrase 0 points 0 points  Total Score  0 points    Immunizations Immunization History  Administered Date(s) Administered   Fluad Quad(high Dose 65+) 10/04/2019, 10/03/2021   Influenza, High Dose Seasonal PF 10/11/2014, 11/01/2015, 10/27/2016, 10/15/2017, 10/26/2018   Influenza-Unspecified 10/26/2012, 10/29/2013, 10/26/2014, 10/28/2015, 10/15/2020   Moderna Covid-19 Vaccine Bivalent Booster 33yr & up 10/03/2022   PFIZER(Purple Top)SARS-COV-2 Vaccination 01/11/2020, 02/01/2020, 10/01/2020   Pfizer Covid-19 Vaccine Bivalent Booster 5y-11y 09/19/2021   Pneumococcal Conjugate-13 05/23/2014   Pneumococcal Polysaccharide-23 07/06/2011   Tdap 04/06/2011, 01/08/2021   Zoster Recombinat (Shingrix) 03/20/2020, 07/27/2020   Zoster, Live 07/06/2011   Screening Tests Health Maintenance  Topic Date Due   COVID-19 Vaccine (6 - 2023-24 season) 12/10/2022 (Originally 11/28/2022)   INFLUENZA VACCINE  03/20/2023 (Originally 07/20/2022)   Medicare Annual Wellness (AWV)  11/25/2023   DTaP/Tdap/Td (3 - Td or Tdap) 01/08/2031   Pneumonia Vaccine 81 Years old  Completed   DEXA SCAN  Completed   Zoster Vaccines- Shingrix  Completed   HPV VACCINES  Aged Out   Health Maintenance There are no preventive care reminders to display for this patient.  Lung Cancer Screening: (Low Dose CT Chest recommended if Age 81-80years, 30 pack-year currently smoking OR have quit w/in 15years.) does not qualify.   Hepatitis C Screening: does not qualify.  Vision Screening: Recommended annual ophthalmology exams for early detection of glaucoma and other disorders of the eye.  Dental Screening: Recommended annual dental exams for proper oral hygiene.  Community Resource Referral / Chronic Care Management: CRR  required this visit?  No   CCM required this visit?  No      Plan:     I have personally reviewed and noted the following in the patient's chart:   Medical and social history Use of alcohol, tobacco or illicit drugs  Current medications and supplements including opioid prescriptions. Patient is not currently taking opioid prescriptions. Functional ability and status Nutritional status Physical activity Advanced directives List of other physicians Hospitalizations, surgeries, and ER visits in previous 12 months Vitals Screenings to include cognitive, depression, and falls Referrals and appointments  In addition, I have reviewed and discussed with patient certain preventive protocols, quality metrics, and best practice recommendations. A written personalized care plan for preventive services as well as general preventive health recommendations were provided to patient.     DLeta Jungling LPN   [id number]

## 2022-11-24 NOTE — Patient Instructions (Addendum)
Stephanie Mora , Thank you for taking time to come for your Medicare Wellness Visit. I appreciate your ongoing commitment to your health goals. Please review the following plan we discussed and let me know if I can assist you in the future.   These are the goals we discussed:  Goals       Patient Stated     Healthy lifestyle (pt-stated)      Increase physical activity. I would like to lose a little weight. Monitor carb intake.        This is a list of the screening recommended for you and due dates:  Health Maintenance  Topic Date Due   COVID-19 Vaccine (6 - 2023-24 season) 12/10/2022*   Flu Shot  03/20/2023*   Medicare Annual Wellness Visit  11/25/2023   DTaP/Tdap/Td vaccine (3 - Td or Tdap) 01/08/2031   Pneumonia Vaccine  Completed   DEXA scan (bone density measurement)  Completed   Zoster (Shingles) Vaccine  Completed   HPV Vaccine  Aged Out  *Topic was postponed. The date shown is not the original due date.   Advanced directives: End of life planning; Advance aging; Advanced directives discussed.  Copy of current HCPOA/Living Will requested.    Conditions/risks identified: none new  Next appointment: Follow up in one year for your annual wellness visit    Preventive Care 65 Years and Older, Female Preventive care refers to lifestyle choices and visits with your health care provider that can promote health and wellness. What does preventive care include? A yearly physical exam. This is also called an annual well check. Dental exams once or twice a year. Routine eye exams. Ask your health care provider how often you should have your eyes checked. Personal lifestyle choices, including: Daily care of your teeth and gums. Regular physical activity. Eating a healthy diet. Avoiding tobacco and drug use. Limiting alcohol use. Practicing safe sex. Taking low-dose aspirin every day. Taking vitamin and mineral supplements as recommended by your health care provider. What  happens during an annual well check? The services and screenings done by your health care provider during your annual well check will depend on your age, overall health, lifestyle risk factors, and family history of disease. Counseling  Your health care provider may ask you questions about your: Alcohol use. Tobacco use. Drug use. Emotional well-being. Home and relationship well-being. Sexual activity. Eating habits. History of falls. Memory and ability to understand (cognition). Work and work Statistician. Reproductive health. Screening  You may have the following tests or measurements: Height, weight, and BMI. Blood pressure. Lipid and cholesterol levels. These may be checked every 5 years, or more frequently if you are over 38 years old. Skin check. Lung cancer screening. You may have this screening every year starting at age 26 if you have a 30-pack-year history of smoking and currently smoke or have quit within the past 15 years. Fecal occult blood test (FOBT) of the stool. You may have this test every year starting at age 24. Flexible sigmoidoscopy or colonoscopy. You may have a sigmoidoscopy every 5 years or a colonoscopy every 10 years starting at age 95. Hepatitis C blood test. Hepatitis B blood test. Sexually transmitted disease (STD) testing. Diabetes screening. This is done by checking your blood sugar (glucose) after you have not eaten for a while (fasting). You may have this done every 1-3 years. Bone density scan. This is done to screen for osteoporosis. You may have this done starting at age 30. Mammogram. This  may be done every 1-2 years. Talk to your health care provider about how often you should have regular mammograms. Talk with your health care provider about your test results, treatment options, and if necessary, the need for more tests. Vaccines  Your health care provider may recommend certain vaccines, such as: Influenza vaccine. This is recommended every  year. Tetanus, diphtheria, and acellular pertussis (Tdap, Td) vaccine. You may need a Td booster every 10 years. Zoster vaccine. You may need this after age 28. Pneumococcal 13-valent conjugate (PCV13) vaccine. One dose is recommended after age 30. Pneumococcal polysaccharide (PPSV23) vaccine. One dose is recommended after age 76. Talk to your health care provider about which screenings and vaccines you need and how often you need them. This information is not intended to replace advice given to you by your health care provider. Make sure you discuss any questions you have with your health care provider. Document Released: 01/02/2016 Document Revised: 08/25/2016 Document Reviewed: 10/07/2015 Elsevier Interactive Patient Education  2017 [place] Prevention in the Home Falls can cause injuries. They can happen to people of all ages. There are many things you can do to make your home safe and to help prevent falls. What can I do on the outside of my home? Regularly fix the edges of walkways and driveways and fix any cracks. Remove anything that might make you trip as you walk through a door, such as a raised step or threshold. Trim any bushes or trees on the path to your home. Use bright outdoor lighting. Clear any walking paths of anything that might make someone trip, such as rocks or tools. Regularly check to see if handrails are loose or broken. Make sure that both sides of any steps have handrails. Any raised decks and porches should have guardrails on the edges. Have any leaves, snow, or ice cleared regularly. Use sand or salt on walking paths during winter. Clean up any spills in your garage right away. This includes oil or grease spills. What can I do in the bathroom? Use night lights. Install grab bars by the toilet and in the tub and shower. Do not use towel bars as grab bars. Use non-skid mats or decals in the tub or shower. If you need to sit down in the shower, use a  plastic, non-slip stool. Keep the floor dry. Clean up any water that spills on the floor as soon as it happens. Remove soap buildup in the tub or shower regularly. Attach bath mats securely with double-sided non-slip rug tape. Do not have throw rugs and other things on the floor that can make you trip. What can I do in the bedroom? Use night lights. Make sure that you have a light by your bed that is easy to reach. Do not use any sheets or blankets that are too big for your bed. They should not hang down onto the floor. Have a firm chair that has side arms. You can use this for support while you get dressed. Do not have throw rugs and other things on the floor that can make you trip. What can I do in the kitchen? Clean up any spills right away. Avoid walking on wet floors. Keep items that you use a lot in easy-to-reach places. If you need to reach something above you, use a strong step stool that has a grab bar. Keep electrical cords out of the way. Do not use floor polish or wax that makes floors slippery. If you must  use wax, use non-skid floor wax. Do not have throw rugs and other things on the floor that can make you trip. What can I do with my stairs? Do not leave any items on the stairs. Make sure that there are handrails on both sides of the stairs and use them. Fix handrails that are broken or loose. Make sure that handrails are as long as the stairways. Check any carpeting to make sure that it is firmly attached to the stairs. Fix any carpet that is loose or worn. Avoid having throw rugs at the top or bottom of the stairs. If you do have throw rugs, attach them to the floor with carpet tape. Make sure that you have a light switch at the top of the stairs and the bottom of the stairs. If you do not have them, ask someone to add them for you. What else can I do to help prevent falls? Wear shoes that: Do not have high heels. Have rubber bottoms. Are comfortable and fit you  well. Are closed at the toe. Do not wear sandals. If you use a stepladder: Make sure that it is fully opened. Do not climb a closed stepladder. Make sure that both sides of the stepladder are locked into place. Ask someone to hold it for you, if possible. Clearly mark and make sure that you can see: Any grab bars or handrails. First and last steps. Where the edge of each step is. Use tools that help you move around (mobility aids) if they are needed. These include: Canes. Walkers. Scooters. Crutches. Turn on the lights when you go into a dark area. Replace any light bulbs as soon as they burn out. Set up your furniture so you have a clear path. Avoid moving your furniture around. If any of your floors are uneven, fix them. If there are any pets around you, be aware of where they are. Review your medicines with your doctor. Some medicines can make you feel dizzy. This can increase your chance of falling. Ask your doctor what other things that you can do to help prevent falls. This information is not intended to replace advice given to you by your health care provider. Make sure you discuss any questions you have with your health care provider. Document Released: 10/02/2009 Document Revised: 05/13/2016 Document Reviewed: 01/10/2015 Elsevier Interactive Patient Education  2017 [employer].

## 2022-12-03 DIAGNOSIS — M7550 Bursitis of unspecified shoulder: Secondary | ICD-10-CM | POA: Insufficient documentation

## 2022-12-08 ENCOUNTER — Encounter: Payer: Self-pay | Admitting: Family Medicine

## 2023-01-08 ENCOUNTER — Telehealth: Payer: Self-pay | Admitting: *Deleted

## 2023-01-08 NOTE — Telephone Encounter (Signed)
Sent message to [employer] rep regarding difference in OOP% from 5%-20%.  PA required-sent to Prior Auth team

## 2023-01-12 NOTE — Telephone Encounter (Signed)
Benefits have been corrected. Pt owes $99, PA on file.  Authorization Status Approved Authorization Number [id number]     Authorization Start Date 06-08-2022 Authorization End Date 06-09-2023   T9122  Injection Denosumab  60 mg   once every 180 days 2 doses

## 2023-01-13 ENCOUNTER — Ambulatory Visit: Payer: Medicare Other | Admitting: *Deleted

## 2023-01-13 DIAGNOSIS — M810 Age-related osteoporosis without current pathological fracture: Secondary | ICD-10-CM | POA: Diagnosis not present

## 2023-01-13 MED ORDER — DENOSUMAB 60 MG/ML ~~LOC~~ SOSY
60.0000 mg | PREFILLED_SYRINGE | Freq: Once | SUBCUTANEOUS | Status: AC
  Administered 2023-01-13: 60 mg via SUBCUTANEOUS

## 2023-01-13 NOTE — Progress Notes (Signed)
Pt received Prolia Injection in left arm (subcutaneous). Pt tolerated it well with no complaints or concerns.

## 2023-02-04 ENCOUNTER — Other Ambulatory Visit: Payer: Self-pay | Admitting: Family Medicine

## 2023-02-16 ENCOUNTER — Encounter: Payer: Medicare Other | Admitting: Family Medicine

## 2023-02-25 ENCOUNTER — Encounter: Payer: Medicare Other | Admitting: Family Medicine

## 2023-03-04 ENCOUNTER — Encounter: Payer: Self-pay | Admitting: Family Medicine

## 2023-03-04 ENCOUNTER — Ambulatory Visit (INDEPENDENT_AMBULATORY_CARE_PROVIDER_SITE_OTHER): Payer: Medicare Other | Admitting: Family Medicine

## 2023-03-04 ENCOUNTER — Other Ambulatory Visit: Payer: Self-pay | Admitting: Family Medicine

## 2023-03-04 VITALS — BP 122/76 | HR 84 | Temp 97.5°F | Ht 62.0 in | Wt 162.0 lb

## 2023-03-04 DIAGNOSIS — Z0001 Encounter for general adult medical examination with abnormal findings: Secondary | ICD-10-CM

## 2023-03-04 DIAGNOSIS — R1310 Dysphagia, unspecified: Secondary | ICD-10-CM | POA: Diagnosis not present

## 2023-03-04 DIAGNOSIS — E785 Hyperlipidemia, unspecified: Secondary | ICD-10-CM

## 2023-03-04 DIAGNOSIS — R7303 Prediabetes: Secondary | ICD-10-CM | POA: Diagnosis not present

## 2023-03-04 DIAGNOSIS — R251 Tremor, unspecified: Secondary | ICD-10-CM

## 2023-03-04 DIAGNOSIS — M7070 Other bursitis of hip, unspecified hip: Secondary | ICD-10-CM | POA: Insufficient documentation

## 2023-03-04 DIAGNOSIS — E538 Deficiency of other specified B group vitamins: Secondary | ICD-10-CM

## 2023-03-04 DIAGNOSIS — M7061 Trochanteric bursitis, right hip: Secondary | ICD-10-CM

## 2023-03-04 DIAGNOSIS — M25511 Pain in right shoulder: Secondary | ICD-10-CM | POA: Insufficient documentation

## 2023-03-04 MED ORDER — OMEPRAZOLE 20 MG PO CPDR: 20.0000 mg | DELAYED_RELEASE_CAPSULE | Freq: Every day | ORAL | 0 refills | Status: DC

## 2023-03-04 NOTE — Addendum Note (Signed)
Addended by: Leeanne Rio on: 03/04/2023 03:24 PM   Modules accepted: Orders

## 2023-03-04 NOTE — Patient Instructions (Signed)
Nice to see you. Will get lab work today and contact you with the results. Please try the Prilosec for 30 days.  Please let me know how this goes for you.

## 2023-03-04 NOTE — Assessment & Plan Note (Signed)
Physical exam completed.  Encouraged healthy diet and exercise.  Lab work as outlined.  She will have her mammogram in June.  I encouraged her to consider getting the RSV vaccine.

## 2023-03-04 NOTE — Assessment & Plan Note (Signed)
Much improved.  Possibly rotator cuff impingement or bursitis.  If it worsens again she will let me know.

## 2023-03-04 NOTE — Assessment & Plan Note (Signed)
Chronic issue.  Patient previously completed GI evaluation for this.  Discussed that reflux could contribute to this.  We will try her on a 30-day course of Prilosec.  She will let us know how this works for her.

## 2023-03-04 NOTE — Progress Notes (Signed)
Tommi Rumps, MD Phone: [phone]  Stephanie Mora is a 82 y.o. female who presents today for CPE.  Diet: lean meats, not enough fruits or vegetables, not much fatty foods, no soda Exercise: 2x/week silver sneakers Colonoscopy: aged out Mammogram: due in June Family history  Colon cancer: no  Breast cancer: mother, maternal aunt  Ovarian cancer: no Menses: postmenopausal Vaccines-   Flu: UTD  Tetanus: UTD  Shingles: UTD  COVID19: UTD  Pneumonia: UTD  RSV: not sure if she got this Tobacco use: no Alcohol use: rare Illicit Drug use: no Dentist: yes Ophthalmology: yes  GERD/dysphagia: Patient notes issues with dysphagia that been going on for quite some time.  She notes occasionally meats will feel as though they slowdown in her chest.  She had an EGD in the past and did have a dilation of her distal esophageal sphincter though reports they did not dilate anything in her esophagus and notes they did not see anything abnormal in her esophagus.  Patient wonders about getting B12 test as screening.  Tremor: Patient notes intermittent issues with tremor if she is trying to do something.  She notes no resting tremor.  Right shoulder pain: Patient notes this started 2 months ago though has improved significantly.  It is not really bothering her much at this time.  Right hip bursitis: She got a shot for this previously though notes they did not have the right spot.  She supposed to go back for another shot though did not and notes this is 90-95% better.   Active Ambulatory Problems    Diagnosis Date Noted   Depression 07/05/2012   History of right breast cancer 07/05/2012   Medicare annual wellness visit, subsequent 05/09/2013   Osteoporosis 05/23/2014   Hyperlipidemia 05/23/2014   Obesity (BMI 30-39.9) 05/23/2014   Abnormal EKG 05/31/2014   Trigger finger, acquired 12/05/2015   Encounter for general adult medical examination with abnormal findings 12/27/2016   Tremor  12/27/2016   GERD (gastroesophageal reflux disease) 12/27/2016   Pre-diabetes 06/27/2017   Skin nodule 06/27/2017   Cerumen impaction 06/27/2017   Stress incontinence 12/31/2017   Hearing difficulty 12/31/2017   Changes in vision 12/31/2017   Rash 07/28/2018   Dysphagia 01/29/2019   Orthostatic lightheadedness 08/02/2019   Hypertension 11/03/2020   Lightheadedness 08/16/2022   Sleeping difficulty 08/16/2022   Right hip pain 10/12/2022   Hip bursitis 03/04/2023   Right shoulder pain 03/04/2023   Resolved Ambulatory Problems    Diagnosis Date Noted   Hyperlipidemia LDL goal < 100 07/05/2012   Osteoporosis 07/05/2012   Splitting of nail 05/09/2013   Belching 05/09/2013   Hematuria 05/09/2013   Trigger finger, acquired 05/09/2013   Abdominal pain, left lower quadrant 11/12/2013   Right shoulder pain 05/23/2014   Subacromial bursitis 06/06/2014   HLD (hyperlipidemia) 07/05/2012   Cerumen impaction 11/25/2014   Dysuria 11/25/2014   Elevated BP 12/05/2015   Dermatitis 12/05/2015   Past Medical History:  Diagnosis Date   Bilateral dry eyes    Breast cancer ([place]) 2000   Heart murmur    Personal history of radiation therapy 2000    Family History  Problem Relation Age of Onset   Cancer Mother    Breast cancer Mother 47   Heart disease Father    Heart attack Father 51   Cancer Maternal Aunt    Breast cancer Maternal Aunt        ? age   Breast cancer Paternal 41  Social History   Socioeconomic History   Marital status: Married    Spouse name: Not on file   Number of children: Not on file   Years of education: Not on file   Highest education level: Not on file  Occupational History   Not on file  Tobacco Use   Smoking status: Former    Years: 25    Types: Cigarettes    Quit date: 12/20/1993    Years since quitting: 29.2   Smokeless tobacco: Never  Vaping Use   Vaping Use: Never used  Substance and Sexual Activity   Alcohol use: Yes    Comment:  occasional   Drug use: No   Sexual activity: Not on file  Other Topics Concern   Not on file  Social History Narrative   Lives in [place] with husband.      Diet - healthy diet   Exercise - [employer] weight training   Social Determinants of Health   Financial Resource Strain: Low Risk  (11/24/2022)   Overall Financial Resource Strain (CARDIA)    Difficulty of Paying Living Expenses: Not hard at all  Food Insecurity: No Food Insecurity (11/24/2022)   Hunger Vital Sign    Worried About Running Out of Food in the Last Year: Never true    Ran Out of Food in the Last Year: Never true  Transportation Needs: No Transportation Needs (11/24/2022)   PRAPARE - Hydrologist (Medical): No    Lack of Transportation (Non-Medical): No  Physical Activity: Insufficiently Active (11/24/2022)   Exercise Vital Sign    Days of Exercise per Week: 2 days    Minutes of Exercise per Session: 60 min  Stress: No Stress Concern Present (11/24/2022)   [place]    Feeling of Stress : Not at all  Social Connections: Unknown (11/24/2022)   Social Connection and Isolation Panel [NHANES]    Frequency of Communication with Friends and Family: More than three times a week    Frequency of Social Gatherings with Friends and Family: More than three times a week    Attends Religious Services: More than 4 times per year    Active Member of [employer] or Organizations: Not on file    Attends Archivist Meetings: Not on file    Marital Status: Married  Intimate Partner Violence: Not At Risk (11/24/2022)   Humiliation, Afraid, Rape, and Kick questionnaire    Fear of Current or Ex-Partner: No    Emotionally Abused: No    Physically Abused: No    Sexually Abused: No    ROS  General:  Negative for nexplained weight loss, fever Skin: Negative for new or changing mole, sore that won't heal HEENT: Negative for trouble  hearing, trouble seeing, ringing in ears, mouth sores, hoarseness, change in voice, dysphagia. CV:  Negative for chest pain, dyspnea, edema, palpitations Resp: Negative for cough, dyspnea, hemoptysis GI: Negative for nausea, vomiting, diarrhea, constipation, abdominal pain, melena, hematochezia. GU: Positive for stress urine incontinence, negative for dysuria, urinary hesitance, hematuria, vaginal or penile discharge, polyuria, sexual difficulty, lumps in testicle or breasts MSK: Positive for joint pain, negative for muscle cramps or aches, joint swelling Neuro: Negative for headaches, weakness, numbness, dizziness, passing out/fainting Psych: Negative for depression, anxiety, memory problems  Objective  Physical Exam Vitals:   03/04/23 1445  BP: 122/76  Pulse: 84  Temp: (!) 97.5 F (36.4 C)  SpO2: 96%  BP Readings from Last 3 Encounters:  03/04/23 122/76  10/11/22 130/70  08/16/22 138/80   Wt Readings from Last 3 Encounters:  03/04/23 162 lb (73.5 kg)  11/24/22 164 lb (74.4 kg)  10/11/22 164 lb 12.8 oz (74.8 kg)    Physical Exam Constitutional:      General: She is not in acute distress.    Appearance: She is not diaphoretic.  HENT:     Head: Normocephalic and atraumatic.  Cardiovascular:     Rate and Rhythm: Normal rate and regular rhythm.     Heart sounds: Normal heart sounds.  Pulmonary:     Effort: Pulmonary effort is normal.     Breath sounds: Normal breath sounds.  Abdominal:     General: Bowel sounds are normal. There is no distension.     Palpations: Abdomen is soft.     Tenderness: There is no abdominal tenderness.  Musculoskeletal:     Right lower leg: No edema.     Left lower leg: No edema.  Lymphadenopathy:     Cervical: No cervical adenopathy.  Skin:    General: Skin is warm and dry.  Neurological:     Mental Status: She is alert.  Psychiatric:        Mood and Affect: Mood normal.      Assessment/Plan:   Encounter for general adult  medical examination with abnormal findings Assessment & Plan: Physical exam completed.  Encouraged healthy diet and exercise.  Lab work as outlined.  She will have her mammogram in June.  I encouraged her to consider getting the RSV vaccine.   Tremor Assessment & Plan: Chronic issue.  Patient with tremor on movement.  Advised this does not seem consistent with Parkinson's.  She will monitor.   Pre-diabetes -     Hemoglobin A1c  Hyperlipidemia, unspecified hyperlipidemia type -     Comprehensive metabolic panel -     Lipid panel  Dysphagia, unspecified type Assessment & Plan: Chronic issue.  Patient previously completed GI evaluation for this.  Discussed that reflux could contribute to this.  We will try her on a 30-day course of Prilosec.  She will let us know how this works for her.  Orders: -     Omeprazole; Take 1 capsule (20 mg total) by mouth daily. Take 30-60 minutes before breakfast  Dispense: 30 capsule; Refill: 0  Trochanteric bursitis of right hip Assessment & Plan: Much improved.  She will monitor for any worsening.   Acute pain of right shoulder Assessment & Plan: Much improved.  Possibly rotator cuff impingement or bursitis.  If it worsens again she will let me know.   Dietary B12 deficiency -     Vitamin B12    Return in about 6 months (around 09/04/2023).   Tommi Rumps, MD [facility]

## 2023-03-04 NOTE — Assessment & Plan Note (Signed)
Chronic issue.  Patient with tremor on movement.  Advised this does not seem consistent with Parkinson's.  She will monitor.

## 2023-03-04 NOTE — Assessment & Plan Note (Signed)
Much improved.  She will monitor for any worsening.

## 2023-03-05 LAB — COMPLETE METABOLIC PANEL WITH GFR
AG Ratio: 1.6 (calc) (ref 1.0–2.5)
ALT: 21 U/L (ref 6–29)
AST: 25 U/L (ref 10–35)
Albumin: 4.1 g/dL (ref 3.6–5.1)
Alkaline phosphatase (APISO): 48 U/L (ref 37–153)
BUN: 22 mg/dL (ref 7–25)
CO2: 27 mmol/L (ref 20–32)
Calcium: 9.5 mg/dL (ref 8.6–10.4)
Chloride: 103 mmol/L (ref 98–110)
Creat: 0.6 mg/dL (ref 0.60–0.95)
Globulin: 2.5 g/dL (calc) (ref 1.9–3.7)
Glucose, Bld: 85 mg/dL (ref 65–99)
Potassium: 4 mmol/L (ref 3.5–5.3)
Sodium: 138 mmol/L (ref 135–146)
Total Bilirubin: 0.4 mg/dL (ref 0.2–1.2)
Total Protein: 6.6 g/dL (ref 6.1–8.1)
eGFR: 90 mL/min/{1.73_m2} (ref >=60)

## 2023-03-05 LAB — LIPID PANEL
Cholesterol: 132 mg/dL (ref <200)
HDL: 49 mg/dL — ABNORMAL LOW (ref >=50)
LDL Cholesterol (Calc): 54 mg/dL (calc)
Non-HDL Cholesterol (Calc): 83 mg/dL (calc) (ref <130)
Total CHOL/HDL Ratio: 2.7 (calc) (ref <5.0)
Triglycerides: 235 mg/dL — ABNORMAL HIGH (ref <150)

## 2023-03-05 LAB — HEMOGLOBIN A1C
Hgb A1c MFr Bld: 6.2 % of total Hgb — ABNORMAL HIGH (ref <5.7)
Mean Plasma Glucose: 131 mg/dL
eAG (mmol/L): 7.3 mmol/L

## 2023-03-05 LAB — VITAMIN B12: Vitamin B-12: 791 pg/mL (ref 200–1100)

## 2023-03-24 ENCOUNTER — Other Ambulatory Visit: Payer: Self-pay | Admitting: Family Medicine

## 2023-03-24 DIAGNOSIS — I10 Essential (primary) hypertension: Secondary | ICD-10-CM

## 2023-03-31 ENCOUNTER — Other Ambulatory Visit: Payer: Self-pay | Admitting: Family Medicine

## 2023-03-31 DIAGNOSIS — R1310 Dysphagia, unspecified: Secondary | ICD-10-CM

## 2023-04-01 MED ORDER — OMEPRAZOLE 20 MG PO CPDR
20.0000 mg | DELAYED_RELEASE_CAPSULE | Freq: Every day | ORAL | 0 refills | Status: DC
Start: 2023-04-01 — End: 2023-04-08

## 2023-04-07 ENCOUNTER — Other Ambulatory Visit: Payer: Self-pay | Admitting: Family Medicine

## 2023-04-07 DIAGNOSIS — R1310 Dysphagia, unspecified: Secondary | ICD-10-CM

## 2023-05-18 ENCOUNTER — Other Ambulatory Visit: Payer: Self-pay | Admitting: Family Medicine

## 2023-05-18 ENCOUNTER — Encounter: Payer: Self-pay | Admitting: Family Medicine

## 2023-05-18 DIAGNOSIS — Z1231 Encounter for screening mammogram for malignant neoplasm of breast: Secondary | ICD-10-CM

## 2023-06-14 ENCOUNTER — Telehealth: Payer: Self-pay

## 2023-06-14 NOTE — Telephone Encounter (Signed)
Authorization number shoud be available within 4 hours

## 2023-06-16 ENCOUNTER — Ambulatory Visit
Admission: RE | Admit: 2023-06-16 | Discharge: 2023-06-16 | Disposition: A | Discharge status: Home / Self Care | Payer: Medicare Other | Source: Ambulatory Visit | Attending: Family Medicine | Admitting: Family Medicine

## 2023-06-16 DIAGNOSIS — Z1231 Encounter for screening mammogram for malignant neoplasm of breast: Secondary | ICD-10-CM | POA: Diagnosis present

## 2023-07-01 ENCOUNTER — Other Ambulatory Visit (HOSPITAL_COMMUNITY): Payer: Self-pay

## 2023-07-01 NOTE — Telephone Encounter (Signed)
Called insurance to get authorization number.    Auth #: [id number]

## 2023-07-01 NOTE — Telephone Encounter (Signed)
Pt ready for scheduling for PROLIA on or after : 07/14/23  Out-of-pocket cost due at time of visit: $332  Primary: UHC MEDICARE Prolia co-insurance: 20% Admin fee co-insurance: $30  Secondary: --- Prolia co-insurance:  Admin fee co-insurance:   Medical Benefit Details: Date Benefits were checked: 06/13/23 Deductible: NO/ Coinsurance: 20%/ Admin Fee: $30  Prior Auth: APPROVED PA# [id number] Expiration Date: 06/14/23-06/13/24   Pharmacy benefit: Copay $375 If patient wants fill through the pharmacy benefit please send prescription to: [facility], and include estimated need by date in rx notes. Pharmacy will ship medication directly to the office.  Patient not eligible for Prolia Copay Card. Copay Card can make patient's cost as little as $25. Link to apply: [email or web address]://[email or web address].[email or web address]/copay  ** This summary of benefits is an estimation of the patient's out-of-pocket cost. Exact cost may very based on individual plan coverage.

## 2023-07-05 NOTE — Telephone Encounter (Addendum)
 Sent mychart message

## 2023-07-24 ENCOUNTER — Encounter: Payer: Self-pay | Admitting: Family Medicine

## 2023-07-25 NOTE — Telephone Encounter (Signed)
Pt called stating she is overdue for her prolia and would like to make an appointment

## 2023-08-04 ENCOUNTER — Encounter (INDEPENDENT_AMBULATORY_CARE_PROVIDER_SITE_OTHER): Payer: Self-pay

## 2023-08-05 ENCOUNTER — Ambulatory Visit: Payer: Medicare Other

## 2023-08-05 DIAGNOSIS — M810 Age-related osteoporosis without current pathological fracture: Secondary | ICD-10-CM

## 2023-08-05 MED ORDER — DENOSUMAB 60 MG/ML ~~LOC~~ SOSY
60.0000 mg | PREFILLED_SYRINGE | Freq: Once | SUBCUTANEOUS | Status: AC
Start: 2023-08-05 — End: 2023-08-05
  Administered 2023-08-05: 60 mg via SUBCUTANEOUS

## 2023-08-05 NOTE — Progress Notes (Signed)
After obtaining consent, and per orders of Dr. Birdie Sons, injection of Prolia given SQ in left arm by Valentino Nose. Patient tolerated injection well.

## 2023-08-10 NOTE — Telephone Encounter (Signed)
All information is documented. This can be given to patient by anyone.  See below:  Hello,   It is time to schedule you next Prolia Injection. You are due on or after 07/14/23.   The amount due is $332   Please call the office to schedule your next Prolia appt.at [phone]   ThanksSilvestre Moment, CMA/[facility] CMA Lead   Last read by Augustine Radar at  1:23 PM on 07/18/2023.

## 2023-08-19 ENCOUNTER — Encounter: Payer: Self-pay | Admitting: Family Medicine

## 2023-09-05 ENCOUNTER — Telehealth: Payer: Self-pay | Admitting: Family Medicine

## 2023-09-05 ENCOUNTER — Encounter: Payer: Self-pay | Admitting: Family Medicine

## 2023-09-05 ENCOUNTER — Ambulatory Visit (INDEPENDENT_AMBULATORY_CARE_PROVIDER_SITE_OTHER): Payer: Medicare Other | Admitting: Family Medicine

## 2023-09-05 VITALS — BP 132/84 | HR 76 | Temp 98.0°F | Ht 62.0 in | Wt 162.8 lb

## 2023-09-05 DIAGNOSIS — E785 Hyperlipidemia, unspecified: Secondary | ICD-10-CM | POA: Diagnosis not present

## 2023-09-05 DIAGNOSIS — R7303 Prediabetes: Secondary | ICD-10-CM

## 2023-09-05 DIAGNOSIS — K219 Gastro-esophageal reflux disease without esophagitis: Secondary | ICD-10-CM

## 2023-09-05 DIAGNOSIS — M810 Age-related osteoporosis without current pathological fracture: Secondary | ICD-10-CM | POA: Diagnosis not present

## 2023-09-05 DIAGNOSIS — R1310 Dysphagia, unspecified: Secondary | ICD-10-CM

## 2023-09-05 LAB — VITAMIN D 25 HYDROXY (VIT D DEFICIENCY, FRACTURES): VITD: 31.9 ng/mL (ref 30.00–100.00)

## 2023-09-05 LAB — HEMOGLOBIN A1C: Hgb A1c MFr Bld: 6 % (ref 4.6–6.5)

## 2023-09-05 NOTE — Assessment & Plan Note (Signed)
Chronic issue.  Adequately controlled.  She notes she was not fasting during her last lipid panel.  This could explain her mildly elevated triglycerides.  Discussed we would not treat triglycerides specifically until they were over 500.  Patient will continue simvastatin 20 mg daily.

## 2023-09-05 NOTE — Assessment & Plan Note (Signed)
Chronic issue.  She has been seen by GI for this and notes she is not going to do anything further for it at this time.  She will monitor.

## 2023-09-05 NOTE — Assessment & Plan Note (Signed)
Chronic issue.  Patient is on Prolia every 6 months.  We will send her DEXA scan order to [facility] to be completed.  She will need calcium and vitamin D supplementation.  We are checking vitamin D today.

## 2023-09-05 NOTE — Telephone Encounter (Signed)
I placed an order for a bone density scan for this patient.  This needs to be sent to [facility] to be completed as that is where she has had it done previously.  Can you get this sent over to them?  Thanks.

## 2023-09-05 NOTE — Patient Instructions (Signed)
Nice to see you. Please stop the Prilosec.  You can take Pepcid if needed for reflux symptoms. Somebody should call you to schedule the bone density scan.  If you do not hear from [place] on this in the next several weeks please let us know.

## 2023-09-05 NOTE — Progress Notes (Signed)
Stephanie Alar, MD Phone: [phone]  Stephanie Mora is a 82 y.o. female who presents today for follow-up.  Hyperlipidemia: Taking simvastatin.  No right upper quadrant pain or myalgias.  Osteoporosis: Has had 3 Prolia injections.  She wonders if she should be on Prilosec given risk of brittle bones with Prilosec.  She is due for DEXA scan.  GERD/belching: Patient notes this may be got a little better on the Prilosec though nothing significant.  She has not had any reflux symptoms.  She does have some dysphagia at times and notes she had evaluation for this in the past with no obvious cause.  This has not improved with the Prilosec.  Social History   Tobacco Use  Smoking Status Former   Current packs/day: 0.00   Types: Cigarettes   Start date: 12/20/1968   Quit date: 12/20/1993   Years since quitting: 29.7  Smokeless Tobacco Never    Current Outpatient Medications on File Prior to Visit  Medication Sig Dispense Refill   Acetylcysteine (N-ACETYL-L-CYSTEINE PO) Take 600 mcg by mouth 2 (two) times daily.     CALTRATE 600+D3 SOFT 600-20 MG-MCG CHEW      Cholecalciferol (VITAMIN D3) 2000 UNITS TABS Take 1 tablet by mouth daily.     Coenzyme Q10 (COQ10) 100 MG CAPS Take 1 capsule by mouth daily.     losartan (COZAAR) 100 MG tablet TAKE 1 TABLET(100 MG) BY MOUTH DAILY 90 tablet 1   MISC NATURAL PRODUCT OP Take by mouth. Tumeric 500mg      Multiple Vitamin (MULTIVITAMIN) tablet Take 1 tablet by mouth daily.     omeprazole (PRILOSEC) 20 MG capsule TAKE 1 CAPSULE(20 MG) BY MOUTH DAILY 30 TO 60 MINUTES BEFORE BREAKFAST 90 capsule 1   Polyethyl Glycol-Propyl Glycol (SYSTANE) 0.4-0.3 % SOLN Apply to eye.     PROLIA 60 MG/ML SOSY injection      simvastatin (ZOCOR) 20 MG tablet TAKE 1 TABLET(20 MG) BY MOUTH EVERY EVENING 90 tablet 3   venlafaxine XR (EFFEXOR-XR) 150 MG 24 hr capsule TAKE 1 CAPSULE BY MOUTH EVERY DAY 90 capsule 3   vitamin C (ASCORBIC ACID) 500 MG tablet Take 500 mg by mouth 2  (two) times daily.     No current facility-administered medications on file prior to visit.     ROS see history of present illness  Objective  Physical Exam Vitals:   09/05/23 0941  BP: 132/84  Pulse: 76  Temp: 98 F (36.7 C)  SpO2: 97%    BP Readings from Last 3 Encounters:  09/05/23 132/84  03/04/23 122/76  10/11/22 130/70   Wt Readings from Last 3 Encounters:  09/05/23 162 lb 12.8 oz (73.8 kg)  03/04/23 162 lb (73.5 kg)  11/24/22 164 lb (74.4 kg)    Physical Exam Constitutional:      General: She is not in acute distress.    Appearance: She is not diaphoretic.  Cardiovascular:     Rate and Rhythm: Normal rate and regular rhythm.     Heart sounds: Normal heart sounds.  Pulmonary:     Effort: Pulmonary effort is normal.     Breath sounds: Normal breath sounds.  Skin:    General: Skin is warm and dry.  Neurological:     Mental Status: She is alert.      Assessment/Plan: Please see individual problem list.  Gastroesophageal reflux disease, unspecified whether esophagitis present Assessment & Plan: Chronic issue with belching.  Previously suspect that this may have been related to  reflux though it has not improved significantly with the Prilosec.  Given her osteoporosis we will have her discontinue the Prilosec and she can take Pepcid over-the-counter to see if that would be beneficial.   Age-related osteoporosis without current pathological fracture Assessment & Plan: Chronic issue.  Patient is on Prolia every 6 months.  We will send her DEXA scan order to [facility] to be completed.  She will need calcium and vitamin D supplementation.  We are checking vitamin D today.  Orders: -     VITAMIN D 25 Hydroxy (Vit-D Deficiency, Fractures) -     DG Bone Density; Future  Hyperlipidemia, unspecified hyperlipidemia type Assessment & Plan: Chronic issue.  Adequately controlled.  She notes she was not fasting during her last lipid panel.  This could explain her  mildly elevated triglycerides.  Discussed we would not treat triglycerides specifically until they were over 500.  Patient will continue simvastatin 20 mg daily.   Pre-diabetes -     Hemoglobin A1c  Dysphagia, unspecified type Assessment & Plan: Chronic issue.  She has been seen by GI for this and notes she is not going to do anything further for it at this time.  She will monitor.     Return in about 6 months (around 03/04/2024).   Stephanie Alar, MD [facility] Primary [facility]

## 2023-09-05 NOTE — Assessment & Plan Note (Signed)
Chronic issue with belching.  Previously suspect that this may have been related to reflux though it has not improved significantly with the Prilosec.  Given her osteoporosis we will have her discontinue the Prilosec and she can take Pepcid over-the-counter to see if that would be beneficial.

## 2023-09-09 NOTE — Telephone Encounter (Signed)
[facility] called stating they received a fax about an order but they are not sure want it is for. They stated to call back on Monday because they shut their phones off at 1pm [phone]

## 2023-09-14 ENCOUNTER — Telehealth: Payer: Self-pay | Admitting: Family Medicine

## 2023-09-14 ENCOUNTER — Other Ambulatory Visit: Payer: Self-pay | Admitting: Family Medicine

## 2023-09-14 NOTE — Telephone Encounter (Signed)
Great Afternoon! I am faxing the bone density order because there is another phone message where [facility] called wanting it faxed. Have a great Evening!

## 2023-09-14 NOTE — Telephone Encounter (Signed)
French Ana from [facility], [phone] and fax # is [phone]. She needs a bone density order sent for patient.

## 2023-09-14 NOTE — Telephone Encounter (Signed)
Faxed a printed copy of the bone density to [facility] at the number [facility] provided. Received an okay confirmation.

## 2023-09-19 ENCOUNTER — Other Ambulatory Visit: Payer: Self-pay | Admitting: Family Medicine

## 2023-09-19 DIAGNOSIS — I10 Essential (primary) hypertension: Secondary | ICD-10-CM

## 2023-09-20 LAB — HM DEXA SCAN

## 2023-09-21 ENCOUNTER — Encounter: Payer: Self-pay | Admitting: Family Medicine

## 2023-09-22 NOTE — Telephone Encounter (Signed)
I will fax it to [facility] now at [phone]

## 2023-09-22 NOTE — Telephone Encounter (Signed)
I faxed it to [phone] on 09/14/23.

## 2023-10-03 ENCOUNTER — Encounter: Payer: Self-pay | Admitting: Family Medicine

## 2023-10-04 ENCOUNTER — Encounter: Payer: Self-pay | Admitting: Family Medicine

## 2023-10-04 NOTE — Telephone Encounter (Signed)
Flu & Covid vaccines have been recorded in the chart.

## 2023-10-19 ENCOUNTER — Telehealth: Payer: Self-pay | Admitting: Family Medicine

## 2023-10-19 NOTE — Telephone Encounter (Signed)
Left message for Patient to call the office back and schedule an appointment.

## 2023-10-19 NOTE — Telephone Encounter (Signed)
Patient just called and asked can she get some medication for a UTI. The pharmacy she use is [id number] - [place], [place] - [address] AT [facility] [address] [facility] [address] [place], [place] [address] Phone: [phone]  Fax: [phone]  Her number is [phone]

## 2023-10-20 NOTE — Telephone Encounter (Signed)
I just scheduled patient an appointment for tomorrow at 11am.

## 2023-10-20 NOTE — Telephone Encounter (Signed)
Noted. Plan to see patient as scheduled.

## 2023-10-21 ENCOUNTER — Ambulatory Visit (INDEPENDENT_AMBULATORY_CARE_PROVIDER_SITE_OTHER): Payer: Medicare Other | Admitting: Family Medicine

## 2023-10-21 ENCOUNTER — Encounter: Payer: Self-pay | Admitting: Family Medicine

## 2023-10-21 VITALS — BP 126/80 | HR 65 | Temp 97.8°F | Ht 62.0 in | Wt 163.6 lb

## 2023-10-21 DIAGNOSIS — R300 Dysuria: Secondary | ICD-10-CM

## 2023-10-21 DIAGNOSIS — N3090 Cystitis, unspecified without hematuria: Secondary | ICD-10-CM | POA: Diagnosis not present

## 2023-10-21 LAB — POC URINALSYSI DIPSTICK (AUTOMATED)
Bilirubin, UA: NEGATIVE
Glucose, UA: NEGATIVE
Ketones, UA: NEGATIVE
Nitrite, UA: POSITIVE
Protein, UA: NEGATIVE
Spec Grav, UA: 1.015 (ref 1.010–1.025)
Urobilinogen, UA: 0.2 U/dL
pH, UA: 7 (ref 5.0–8.0)

## 2023-10-21 LAB — URINALYSIS, MICROSCOPIC ONLY

## 2023-10-21 MED ORDER — CEFADROXIL 500 MG PO CAPS
500.0000 mg | ORAL_CAPSULE | Freq: Two times a day (BID) | ORAL | 0 refills | Status: DC
Start: 2023-10-21 — End: 2024-03-05

## 2023-10-21 NOTE — Patient Instructions (Signed)
Nice to see you. I sent in cefadroxil for you to start on for UTI. We will contact you when your urine culture returns.  If your symptoms are not improving by early next week please let me know.

## 2023-10-21 NOTE — Progress Notes (Signed)
[name], MD Phone: [phone]  DENISHIA CITRO is a 82 y.o. female who presents today for same-day visit.  Dysuria: Patient notes 5 days of dysuria.  Has urinary urgency.  Possibly had some mild pink tinge to her urine previously.  No abdominal pain.  She notes no urinary frequency.  Has a history of UTIs in the distant past and this feels similar.  She is been using cranberry juice.  No Azo.  Social History   Tobacco Use  Smoking Status Former   Current packs/day: 0.00   Types: Cigarettes   Start date: 12/20/1968   Quit date: 12/20/1993   Years since quitting: 29.8  Smokeless Tobacco Never    Current Outpatient Medications on File Prior to Visit  Medication Sig Dispense Refill   Acetylcysteine (N-ACETYL-L-CYSTEINE PO) Take 600 mcg by mouth 2 (two) times daily.     CALTRATE 600+D3 SOFT 600-20 MG-MCG CHEW      Cholecalciferol (VITAMIN D3) 2000 UNITS TABS Take 1 tablet by mouth daily.     Coenzyme Q10 (COQ10) 100 MG CAPS Take 1 capsule by mouth daily.     losartan (COZAAR) 100 MG tablet TAKE 1 TABLET(100 MG) BY MOUTH DAILY 90 tablet 1   MISC NATURAL PRODUCT OP Take by mouth. Tumeric 500mg      Multiple Vitamin (MULTIVITAMIN) tablet Take 1 tablet by mouth daily.     Polyethyl Glycol-Propyl Glycol (SYSTANE) 0.4-0.3 % SOLN Apply to eye.     PROLIA 60 MG/ML SOSY injection      simvastatin (ZOCOR) 20 MG tablet TAKE 1 TABLET(20 MG) BY MOUTH EVERY EVENING 90 tablet 3   venlafaxine XR (EFFEXOR-XR) 150 MG 24 hr capsule TAKE 1 CAPSULE BY MOUTH EVERY DAY 90 capsule 3   vitamin C (ASCORBIC ACID) 500 MG tablet Take 500 mg by mouth 2 (two) times daily.     omeprazole (PRILOSEC) 20 MG capsule TAKE 1 CAPSULE(20 MG) BY MOUTH DAILY 30 TO 60 MINUTES BEFORE BREAKFAST (Patient not taking: Reported on 10/21/2023) 90 capsule 1   No current facility-administered medications on file prior to visit.     ROS see history of present illness  Objective  Physical Exam Vitals:   10/21/23 1115  BP:  126/80  Pulse: 65  Temp: 97.8 F (36.6 C)  SpO2: 98%    BP Readings from Last 3 Encounters:  10/21/23 126/80  09/05/23 132/84  03/04/23 122/76   Wt Readings from Last 3 Encounters:  10/21/23 163 lb 9.6 oz (74.2 kg)  09/05/23 162 lb 12.8 oz (73.8 kg)  03/04/23 162 lb (73.5 kg)    Physical Exam Constitutional:      General: She is not in acute distress. Abdominal:     General: Bowel sounds are normal. There is no distension.     Palpations: Abdomen is soft.     Tenderness: There is no abdominal tenderness.  Neurological:     Mental Status: She is alert.      Assessment/Plan: Please see individual problem list.  Cystitis Assessment & Plan: Patient's symptoms and urinalysis are consistent with a UTI.  We will treat with cefadroxil 500 mg twice daily for 7 days.  Will send urine for culture microscopy.  If symptoms are not improving by early next week she will let us know.  If she develops excessive diarrhea she will let us know as well.  Discussed risk of diarrhea with the antibiotic.  Orders: -     Cefadroxil; Take 1 capsule (500 mg total) by mouth  2 (two) times daily.  Dispense: 14 capsule; Refill: 0 -     Urine Microscopic -     Urine Culture  Burning with urination -     POCT Urinalysis Dipstick (Automated)     Return if symptoms worsen or fail to improve.   [name], MD [facility] Primary Care [facility]

## 2023-10-21 NOTE — Assessment & Plan Note (Signed)
Patient's symptoms and urinalysis are consistent with a UTI.  We will treat with cefadroxil 500 mg twice daily for 7 days.  Will send urine for culture microscopy.  If symptoms are not improving by early next week she will let us know.  If she develops excessive diarrhea she will let us know as well.  Discussed risk of diarrhea with the antibiotic.

## 2023-10-23 LAB — URINE CULTURE
MICRO NUMBER:: 15675869
SPECIMEN QUALITY:: ADEQUATE

## 2023-11-22 ENCOUNTER — Telehealth: Payer: Self-pay | Admitting: Family Medicine

## 2023-11-22 NOTE — Telephone Encounter (Signed)
Copied from CRM [id number]. Topic: Medicare AWV >> Nov 22, 2023  9:58 AM Payton Doughty wrote: Reason for CRM: Called LVM 11/22/2023 to schedule Annual Wellness Visit  Verlee Rossetti; Care Guide Ambulatory Clinical Support [place] [facility] Direct Dial: [phone]

## 2023-12-19 ENCOUNTER — Encounter: Payer: Self-pay | Admitting: *Deleted

## 2023-12-19 MED ORDER — DENOSUMAB 60 MG/ML ~~LOC~~ SOSY
60.0000 mg | PREFILLED_SYRINGE | SUBCUTANEOUS | Status: DC
Start: 2024-02-03 — End: 2024-09-12
  Administered 2024-02-15: 60 mg via SUBCUTANEOUS

## 2023-12-19 NOTE — Addendum Note (Signed)
Addended by: Warden Fillers on: 12/19/2023 01:20 PM   Modules accepted: Orders

## 2023-12-22 ENCOUNTER — Telehealth: Payer: Self-pay

## 2023-12-22 ENCOUNTER — Other Ambulatory Visit (HOSPITAL_COMMUNITY): Payer: Self-pay

## 2023-12-22 NOTE — Telephone Encounter (Signed)
 Stephanie Mora

## 2023-12-22 NOTE — Telephone Encounter (Signed)
 Pt ready for scheduling for PROLIA  on or after : 02/01/23  Out-of-pocket cost due at time of visit: $363  Number of injection/visits approved: 2  Primary: [employer] MEDICARE Prolia  co-insurance: 20% Admin fee co-insurance: $40  Secondary: --- Prolia  co-insurance:  Admin fee co-insurance:   Medical Benefit Details: Date Benefits were checked: 12/21/23 Deductible: NO/ Coinsurance: 20%/ Admin Fee: $40  Prior Auth: APPROVED PA# [id number] Expiration Date: 06/14/23-06/13/24  # of doses approved: 2  Pharmacy benefit: Copay $123.27 If patient wants fill through the pharmacy benefit please send prescription to: [facility], and include estimated need by date in rx notes. Pharmacy will ship medication directly to the office.  Patient NOT eligible for Prolia  Copay Card. Copay Card can make patient's cost as little as $25. Link to apply: [email or web address]  ** This summary of benefits is an estimation of the patient's out-of-pocket cost. Exact cost may very based on individual plan coverage.

## 2024-02-10 ENCOUNTER — Telehealth: Payer: Self-pay | Admitting: *Deleted

## 2024-02-10 NOTE — Telephone Encounter (Signed)
Left voicemail to return call to schedule Prolia injection.  Amount due: $363 PA: on file Due: NOW

## 2024-02-15 ENCOUNTER — Ambulatory Visit (INDEPENDENT_AMBULATORY_CARE_PROVIDER_SITE_OTHER): Payer: Medicare Other

## 2024-02-15 DIAGNOSIS — M810 Age-related osteoporosis without current pathological fracture: Secondary | ICD-10-CM | POA: Diagnosis not present

## 2024-02-15 MED ORDER — DENOSUMAB 60 MG/ML ~~LOC~~ SOSY
60.0000 mg | PREFILLED_SYRINGE | Freq: Once | SUBCUTANEOUS | Status: AC
Start: 2024-08-14 — End: 2024-10-03
  Administered 2024-10-03: 60 mg via SUBCUTANEOUS

## 2024-02-15 NOTE — Progress Notes (Signed)
 Patient is in office today for a nurse visit for  prolia injection . Injection was given in the abdominal tissue and tolerated well.

## 2024-03-05 ENCOUNTER — Ambulatory Visit: Payer: Medicare Other | Admitting: Family Medicine

## 2024-03-05 ENCOUNTER — Ambulatory Visit (INDEPENDENT_AMBULATORY_CARE_PROVIDER_SITE_OTHER): Payer: Medicare Other | Admitting: Family Medicine

## 2024-03-05 ENCOUNTER — Encounter: Payer: Self-pay | Admitting: Family Medicine

## 2024-03-05 VITALS — BP 120/66 | HR 84 | Temp 98.1°F | Resp 20 | Ht 62.0 in | Wt 164.4 lb

## 2024-03-05 DIAGNOSIS — F39 Unspecified mood [affective] disorder: Secondary | ICD-10-CM

## 2024-03-05 DIAGNOSIS — R7303 Prediabetes: Secondary | ICD-10-CM

## 2024-03-05 DIAGNOSIS — G250 Essential tremor: Secondary | ICD-10-CM

## 2024-03-05 DIAGNOSIS — E559 Vitamin D deficiency, unspecified: Secondary | ICD-10-CM | POA: Diagnosis not present

## 2024-03-05 DIAGNOSIS — E782 Mixed hyperlipidemia: Secondary | ICD-10-CM | POA: Diagnosis not present

## 2024-03-05 DIAGNOSIS — Z0000 Encounter for general adult medical examination without abnormal findings: Secondary | ICD-10-CM | POA: Diagnosis not present

## 2024-03-05 DIAGNOSIS — I10 Essential (primary) hypertension: Secondary | ICD-10-CM | POA: Diagnosis not present

## 2024-03-05 DIAGNOSIS — Z1231 Encounter for screening mammogram for malignant neoplasm of breast: Secondary | ICD-10-CM

## 2024-03-05 DIAGNOSIS — M810 Age-related osteoporosis without current pathological fracture: Secondary | ICD-10-CM

## 2024-03-05 MED ORDER — VENLAFAXINE HCL ER 150 MG PO CP24
150.0000 mg | ORAL_CAPSULE | Freq: Every day | ORAL | 1 refills | Status: DC
Start: 2024-03-05 — End: 2024-09-11

## 2024-03-05 MED ORDER — LOSARTAN POTASSIUM 100 MG PO TABS
100.0000 mg | ORAL_TABLET | Freq: Every day | ORAL | 1 refills | Status: DC
Start: 2024-03-05 — End: 2024-09-11

## 2024-03-05 MED ORDER — SIMVASTATIN 20 MG PO TABS: 20.0000 mg | ORAL_TABLET | Freq: Every day | ORAL | 1 refills | Status: DC

## 2024-03-05 NOTE — Progress Notes (Unsigned)
 SUBJECTIVE:   Chief Complaint  Patient presents with   yearly follow up   HPI Presents for yearly follow up   Discussed the use of AI scribe software for clinical note transcription with the patient, who gave verbal consent to proceed.  History of Present Illness  Stephanie Mora is an 83 year old female who presents for her annual wellness check.  She attends her annual wellness check once a year, which includes a review of her medications and a breast exam. She has been receiving annual mammograms and believes she had one last year. She mentions having dense breasts and is not consistent with self-exams.  She had a lumpectomy in 2000 and is currently on Prolia for osteoporosis. She recalls being on Fosamax previously but does not remember the reason for the switch. She has not experienced any fractures since starting Prolia and tolerates it well.  She is on losartan 100 mg for hypertension, which is well-controlled with a blood pressure of 120/66. She experiences occasional 'spacey' feelings, which she attributes to dehydration, and does not regularly check her blood pressure during these episodes. She has no history of myocardial infarction, cerebrovascular accident, diabetes, or renal disease.  She is pre-diabetic and has not had a recent fasting blood glucose test. She recalls her A1c being around 6. She has experienced hypoglycemic symptoms in the past, such as tremors.  She experiences some tremors, particularly when holding objects like a mirror or a plate. Her husband has essential tremors, and she is aware of the difference between essential tremors and Parkinson's disease. She is currently on Effexor and losartan, and she has a history of elevated blood pressure readings before starting losartan.  She notes her nails are thin and brittle and has considered trying supplements like biotin or collagen. She has discussed this with her dermatologist, who did not provide a specific  recommendation.  She reports being 'a little shorter of breath' than she used to be, especially when running up stairs. No chest pain, significant dizziness, or peripheral edema.    Review of Systems - Negative except listed in HPI      PERTINENT PMH / PSH: As above  OBJECTIVE:  BP 120/66   Pulse 84   Temp 98.1 F (36.7 C)   Resp 20   Ht 5\' 2"  (1.575 m)   Wt 164 lb 6 oz (74.6 kg)   SpO2 98%   BMI 30.06 kg/m    Physical Exam Vitals reviewed. Exam conducted with a chaperone present.  Constitutional:      General: She is not in acute distress.    Appearance: She is obese. She is not ill-appearing.  HENT:     Head: Normocephalic.     Right Ear: Tympanic membrane, ear canal and external ear normal.     Left Ear: Tympanic membrane, ear canal and external ear normal.     Nose: Nose normal.     Mouth/Throat:     Mouth: Mucous membranes are moist.  Eyes:     Extraocular Movements: Extraocular movements intact.     Conjunctiva/sclera: Conjunctivae normal.     Pupils: Pupils are equal, round, and reactive to light.  Neck:     Thyroid: No thyromegaly or thyroid tenderness.     Vascular: No carotid bruit.  Cardiovascular:     Rate and Rhythm: Normal rate and regular rhythm.     Pulses: Normal pulses.     Heart sounds: Normal heart sounds.  Pulmonary:  Effort: Pulmonary effort is normal.     Breath sounds: Normal breath sounds.  Chest:  Breasts:    Right: Normal. No inverted nipple, nipple discharge or tenderness.     Left: Normal. No inverted nipple, nipple discharge or tenderness.  Abdominal:     General: Bowel sounds are normal. There is no distension.     Palpations: Abdomen is soft.     Tenderness: There is no abdominal tenderness. There is no right CVA tenderness, left CVA tenderness, guarding or rebound.  Musculoskeletal:        General: Normal range of motion.     Cervical back: Normal range of motion.     Right lower leg: No edema.     Left lower leg: No  edema.  Lymphadenopathy:     Cervical: No cervical adenopathy.     Upper Body:     Right upper body: No supraclavicular, axillary or pectoral adenopathy.     Left upper body: No supraclavicular, axillary or pectoral adenopathy.  Skin:    Capillary Refill: Capillary refill takes less than 2 seconds.  Neurological:     General: No focal deficit present.     Mental Status: She is alert and oriented to person, place, and time. Mental status is at baseline.     Motor: No weakness.  Psychiatric:        Mood and Affect: Mood normal.        Behavior: Behavior normal.        Thought Content: Thought content normal.        Judgment: Judgment normal.           03/05/2024    2:10 PM 10/21/2023   11:17 AM 09/05/2023    9:41 AM 03/04/2023    2:48 PM 11/24/2022    3:15 PM  Depression screen PHQ 2/9  Decreased Interest 0 0 0 0 0  Down, Depressed, Hopeless 0 0 0 0 0  PHQ - 2 Score 0 0 0 0 0  Altered sleeping 0 0 1 0   Tired, decreased energy 0 0 0 0   Change in appetite 0 0 0 0   Feeling bad or failure about yourself  0 0 0 0   Trouble concentrating 0 0 0 0   Moving slowly or fidgety/restless 0 0 0 0   Suicidal thoughts 0 0 0 0   PHQ-9 Score 0 0 1 0   Difficult doing work/chores Not difficult at all Not difficult at all Not difficult at all Not difficult at all       03/05/2024    2:10 PM 10/21/2023   11:17 AM 09/05/2023    9:42 AM 03/04/2023    2:48 PM  GAD 7 : Generalized Anxiety Score  Nervous, Anxious, on Edge 0 0 0 0  Control/stop worrying 0 0 0 0  Worry too much - different things 0 0 0 0  Trouble relaxing 0 0 0 0  Restless 0 0 0 0  Easily annoyed or irritable 0 0 0 0  Afraid - awful might happen 0 0 0 0  Total GAD 7 Score 0 0 0 0  Anxiety Difficulty Not difficult at all Not difficult at all Not difficult at all Not difficult at all    ASSESSMENT/PLAN:  Annual physical exam Assessment & Plan: Mammogram up to date.  Due 06/25.  Ordered Breast exam completed.  Recommend  regular self breast exams. Annual labs ordered     Mood disorder [facility]) Assessment &  Plan: On Effexor. No immediate concerns. Refill Effexor XR 150 mg daily  Orders: -     Venlafaxine HCl ER; Take 1 capsule (150 mg total) by mouth daily.  Dispense: 90 capsule; Refill: 1  Hypertension, unspecified type Assessment & Plan: Blood pressure well-controlled on losartan 100 mg. - Refill losartan 100 mg daily - Monitor blood pressure . Goal <150/90  Orders: -     Losartan Potassium; Take 1 tablet (100 mg total) by mouth daily.  Dispense: 90 tablet; Refill: 1 -     Comprehensive metabolic panel; Future  Vitamin D deficiency -     VITAMIN D 25 Hydroxy (Vit-D Deficiency, Fractures); Future  Mixed hyperlipidemia Assessment & Plan: Refill Zocor 20 mg daily Check fasting lipids  Orders: -     Simvastatin; Take 1 tablet (20 mg total) by mouth daily at 6 PM.  Dispense: 90 tablet; Refill: 1 -     Lipid panel; Future  Breast cancer screening by mammogram -     3D Screening Mammogram, Left and Right; Future  Pre-diabetes Assessment & Plan: A1c levels around 6.0. Emphasized lifestyle modifications to prevent diabetes progression.  - Check A1c annually - Encourage lifestyle modifications including diet and physical activity.   Age-related osteoporosis without current pathological fracture Assessment & Plan: On Prolia, well-tolerated. No significant fracture history. Vitamin D levels to be checked. - Check vitamin D levels. - Continue Prolia injections.   Essential tremor Assessment & Plan: Occasional tremors during tasks. No resting tremors, wide gait or issues with balance.  No focal deficits on neuro exam.  Symptoms not bothersome enough for medication. Discussed propranolol if symptoms worsen. - Monitor symptoms and consider propranolol if tremors become bothersome.     PDMP reviewed  Return if symptoms worsen or fail to improve, for PCP.  Dana Allan, MD

## 2024-03-05 NOTE — Patient Instructions (Addendum)
 It was a pleasure meeting you today. Thank you for allowing me to take part in your health care.  Our goals for today as we discussed include:  Schedule lab appointment.  Fast for 8-10 hours  Refills sent for requested medications  Referral sent for Mammogram. Please call to schedule appointment. Due June 15 2024 [facility] [address] [place], [place] [address] [phone]      This is a list of the screening recommended for you and due dates:  Health Maintenance  Topic Date Due   Medicare Annual Wellness Visit  11/25/2023   Flu Shot  03/19/2024*   COVID-19 Vaccine (7 - 2024-25 season) 03/29/2024   DTaP/Tdap/Td vaccine (3 - Td or Tdap) 01/08/2031   Pneumonia Vaccine  Completed   DEXA scan (bone density measurement)  Completed   Zoster (Shingles) Vaccine  Completed   HPV Vaccine  Aged Out   Hepatitis C Screening  Discontinued  *Topic was postponed. The date shown is not the original due date.      If you have any questions or concerns, please do not hesitate to call the office at [phone].  I look forward to our next visit and until then take care and stay safe.  Regards,   Dana Allan, MD   [facility]

## 2024-03-08 ENCOUNTER — Encounter: Payer: Self-pay | Admitting: Family Medicine

## 2024-03-08 DIAGNOSIS — E559 Vitamin D deficiency, unspecified: Secondary | ICD-10-CM | POA: Insufficient documentation

## 2024-03-08 DIAGNOSIS — Z1231 Encounter for screening mammogram for malignant neoplasm of breast: Secondary | ICD-10-CM | POA: Insufficient documentation

## 2024-03-08 DIAGNOSIS — Z0000 Encounter for general adult medical examination without abnormal findings: Secondary | ICD-10-CM | POA: Insufficient documentation

## 2024-03-08 DIAGNOSIS — G250 Essential tremor: Secondary | ICD-10-CM | POA: Insufficient documentation

## 2024-03-08 DIAGNOSIS — F39 Unspecified mood [affective] disorder: Secondary | ICD-10-CM | POA: Insufficient documentation

## 2024-03-08 NOTE — Assessment & Plan Note (Signed)
 A1c levels around 6.0. Emphasized lifestyle modifications to prevent diabetes progression.  - Check A1c annually - Encourage lifestyle modifications including diet and physical activity.

## 2024-03-08 NOTE — Assessment & Plan Note (Addendum)
 On Effexor. No immediate concerns. Refill Effexor XR 150 mg daily

## 2024-03-08 NOTE — Assessment & Plan Note (Signed)
 Mammogram up to date.  Due 06/25.  Ordered Breast exam completed.  Recommend regular self breast exams. Annual labs ordered

## 2024-03-08 NOTE — Addendum Note (Signed)
 Addended by: Enid Cutter on: 03/08/2024 04:25 PM   Modules accepted: Level of Service

## 2024-03-08 NOTE — Assessment & Plan Note (Signed)
 Occasional tremors during tasks. No resting tremors, wide gait or issues with balance.  No focal deficits on neuro exam.  Symptoms not bothersome enough for medication. Discussed propranolol if symptoms worsen. - Monitor symptoms and consider propranolol if tremors become bothersome.

## 2024-03-08 NOTE — Assessment & Plan Note (Addendum)
 Blood pressure well-controlled on losartan 100 mg. - Refill losartan 100 mg daily - Monitor blood pressure . Goal <150/90

## 2024-03-08 NOTE — Assessment & Plan Note (Signed)
 On Prolia, well-tolerated. No significant fracture history. Vitamin D levels to be checked. - Check vitamin D levels. - Continue Prolia injections.

## 2024-03-08 NOTE — Addendum Note (Signed)
 Addended by: Enid Cutter on: 03/08/2024 04:04 PM   Modules accepted: Level of Service

## 2024-03-08 NOTE — Assessment & Plan Note (Signed)
 Refill Zocor 20 mg daily Check fasting lipids

## 2024-04-09 ENCOUNTER — Other Ambulatory Visit (INDEPENDENT_AMBULATORY_CARE_PROVIDER_SITE_OTHER)

## 2024-04-09 DIAGNOSIS — E559 Vitamin D deficiency, unspecified: Secondary | ICD-10-CM | POA: Diagnosis not present

## 2024-04-09 DIAGNOSIS — I10 Essential (primary) hypertension: Secondary | ICD-10-CM | POA: Diagnosis not present

## 2024-04-09 DIAGNOSIS — E782 Mixed hyperlipidemia: Secondary | ICD-10-CM

## 2024-04-09 LAB — LIPID PANEL
Cholesterol: 165 mg/dL (ref 0–200)
HDL: 61.7 mg/dL (ref >39.00)
LDL Cholesterol: 77 mg/dL (ref 0–99)
NonHDL: 102.85
Total CHOL/HDL Ratio: 3
Triglycerides: 128 mg/dL (ref 0.0–149.0)
VLDL: 25.6 mg/dL (ref 0.0–40.0)

## 2024-04-09 LAB — COMPREHENSIVE METABOLIC PANEL WITH GFR
ALT: 24 U/L (ref 0–35)
AST: 26 U/L (ref 0–37)
Albumin: 4.2 g/dL (ref 3.5–5.2)
Alkaline Phosphatase: 44 U/L (ref 39–117)
BUN: 19 mg/dL (ref 6–23)
CO2: 29 meq/L (ref 19–32)
Calcium: 9.6 mg/dL (ref 8.4–10.5)
Chloride: 103 meq/L (ref 96–112)
Creatinine, Ser: 0.77 mg/dL (ref 0.40–1.20)
GFR: 71.63 mL/min (ref >60.00)
Glucose, Bld: 105 mg/dL — ABNORMAL HIGH (ref 70–99)
Potassium: 4.2 meq/L (ref 3.5–5.1)
Sodium: 140 meq/L (ref 135–145)
Total Bilirubin: 0.5 mg/dL (ref 0.2–1.2)
Total Protein: 6.9 g/dL (ref 6.0–8.3)

## 2024-04-09 LAB — VITAMIN D 25 HYDROXY (VIT D DEFICIENCY, FRACTURES): VITD: 40.07 ng/mL (ref 30.00–100.00)

## 2024-04-10 ENCOUNTER — Ambulatory Visit

## 2024-04-11 ENCOUNTER — Encounter: Payer: Self-pay | Admitting: Family Medicine

## 2024-06-19 ENCOUNTER — Ambulatory Visit
Admission: RE | Admit: 2024-06-19 | Discharge: 2024-06-19 | Disposition: A | Discharge status: Home / Self Care | Source: Ambulatory Visit | Attending: Family Medicine | Admitting: Family Medicine

## 2024-06-19 DIAGNOSIS — Z1231 Encounter for screening mammogram for malignant neoplasm of breast: Secondary | ICD-10-CM | POA: Insufficient documentation

## 2024-06-20 ENCOUNTER — Ambulatory Visit

## 2024-06-20 VITALS — Ht 62.0 in | Wt 163.0 lb

## 2024-06-20 DIAGNOSIS — Z0000 Encounter for general adult medical examination without abnormal findings: Secondary | ICD-10-CM

## 2024-06-20 NOTE — Patient Instructions (Signed)
 Stephanie Mora , Thank you for taking time out of your busy schedule to complete your Annual Wellness Visit with me. I enjoyed our conversation and look forward to speaking with you again next year. I, as well as your care team,  appreciate your ongoing commitment to your health goals. Please review the following plan we discussed and let me know if I can assist you in the future. Your Game plan/ To Do List    Referrals: If you haven't heard from the office you've been referred to, please reach out to them at the phone provided.  Remember to get your flu and covid vaccines annually Follow up Visits: Next Medicare AWV with our clinical staff: 06/25/25 @ 2:20   Have you seen your provider in the last 6 months (3 months if uncontrolled diabetes)? Yes Next Office Visit with your provider: 09/12/24  Clinician Recommendations:  Aim for 30 minutes of exercise or brisk walking, 6-8 glasses of water, and 5 servings of fruits and vegetables each day.       This is a list of the screening recommended for you and due dates:  Health Maintenance  Topic Date Due   COVID-19 Vaccine (7 - 2024-25 season) 03/29/2024   Mammogram  06/15/2024   Flu Shot  07/20/2024   Medicare Annual Wellness Visit  06/20/2025   DTaP/Tdap/Td vaccine (3 - Td or Tdap) 01/08/2031   Pneumococcal Vaccine for age over 33  Completed   DEXA scan (bone density measurement)  Completed   Zoster (Shingles) Vaccine  Completed   Hepatitis B Vaccine  Aged Out   HPV Vaccine  Aged Out   Meningitis B Vaccine  Aged Out   Hepatitis C Screening  Discontinued    Advanced directives: (Copy Requested) Please bring a copy of your health care power of attorney and living will to the office to be added to your chart at your convenience. You can mail to [facility] [address] [place], [place] [address] or email to [email or web address] Advance Care Planning is important because it:  [x]  Makes sure you receive the medical care that  is consistent with your values, goals, and preferences  [x]  It provides guidance to your family and loved ones and reduces their decisional burden about whether or not they are making the right decisions based on your wishes.

## 2024-06-20 NOTE — Progress Notes (Signed)
 Subjective:   Stephanie Mora is a 83 y.o. who presents for a Medicare Wellness preventive visit.  As a reminder, Annual Wellness Visits don't include a physical exam, and some assessments may be limited, especially if this visit is performed virtually. We may recommend an in-person follow-up visit with your provider if needed.  Visit Complete: Virtual I connected with  Stephanie Mora on 06/20/24 by a audio enabled telemedicine application and verified that I am speaking with the correct person using two identifiers.  Patient Location: Home  Provider Location: Home Office  I discussed the limitations of evaluation and management by telemedicine. The patient expressed understanding and agreed to proceed.  Vital Signs: Because this visit was a virtual/telehealth visit, some criteria may be missing or patient reported. Any vitals not documented were not able to be obtained and vitals that have been documented are patient reported.  VideoDeclined- This patient declined Stephanie Mora. Therefore the visit was completed with audio only.  Persons Participating in Visit: Patient.  AWV Questionnaire: Yes: Patient Medicare AWV questionnaire was completed by the patient on 06/20/24; I have confirmed that all information answered by patient is correct and no changes since this date.  Cardiac Risk Factors include: advanced age (>59men, >38 women);dyslipidemia;hypertension     Objective:    Today's Vitals   06/20/24 1304 06/20/24 1305  Weight: 163 lb (73.9 kg)   Height: 5' 2 (1.575 m)   PainSc:  3    Body mass index is 29.81 kg/m.     06/20/2024    1:19 PM 11/24/2022    3:16 PM 11/19/2021    3:14 PM 11/18/2020    2:04 PM 01/08/2020   12:19 PM 01/08/2020   11:54 AM 11/07/2019    4:04 PM  Advanced Directives  Does Patient Have a Medical Advance Directive? Yes Yes Yes Yes No Yes Yes  Type of Estate agent of [place];Living will  Healthcare Power of [place];Living will Healthcare Power of [place];Living will Healthcare Power of [place];Living will   Healthcare Power of [place];Living will  Does patient want to make changes to medical advance directive?  No - Patient declined No - Patient declined No - Patient declined No - Patient declined  No - Patient declined  Copy of Healthcare Power of Attorney in Chart? No - copy requested No - copy requested No - copy requested No - copy requested   No - copy requested  Would patient like information on creating a medical advance directive?     No - Patient declined      Current Medications (verified) Outpatient Encounter Medications as of 06/20/2024  Medication Sig   CALTRATE 600+D3 SOFT 600-20 MG-MCG CHEW    Cholecalciferol (VITAMIN D3) 2000 UNITS TABS Take 1 tablet by mouth daily.   Coenzyme Q10 (COQ10) 100 MG CAPS Take 1 capsule by mouth daily.   losartan  (COZAAR ) 100 MG tablet Take 1 tablet (100 mg total) by mouth daily.   MISC NATURAL PRODUCT OP Take by mouth. Tumeric 500mg    Multiple Vitamin (MULTIVITAMIN) tablet Take 1 tablet by mouth daily.   Naltrexone HCl, Pain, 4.5 MG CAPS Take by mouth daily.   Polyethyl Glycol-Propyl Glycol (SYSTANE) 0.4-0.3 % SOLN Apply to eye.   PROLIA  60 MG/ML SOSY injection    simvastatin  (ZOCOR ) 20 MG tablet Take 1 tablet (20 mg total) by mouth daily at 6 PM.   venlafaxine  XR (EFFEXOR -XR) 150 MG 24 hr capsule Take 1 capsule (150 mg total)  by mouth daily.   vitamin C (ASCORBIC ACID) 500 MG tablet Take 500 mg by mouth 2 (two) times daily.   Acetylcysteine (N-ACETYL-L-CYSTEINE PO) Take 600 mcg by mouth 2 (two) times daily.   Facility-Administered Encounter Medications as of 06/20/2024  Medication   denosumab  (PROLIA ) injection 60 mg   [START ON 08/14/2024] denosumab  (PROLIA ) injection 60 mg    Allergies (verified) Patient has no known allergies.   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   Heart murmur    as child   Hyperlipidemia    Osteoporosis    reclast treatment in past   Personal history of radiation therapy 2000   F/U right breast cancer   Past Surgical History:  Procedure Laterality Date   BREAST BIOPSY Right 2000   +   BREAST BIOPSY Left 1980's   neg, Dr Dessa   BREAST LUMPECTOMY Right 2000   at [facility]   CATARACT EXTRACTION W/PHACO Right 05/23/2019   Procedure: CATARACT EXTRACTION PHACO AND INTRAOCULAR LENS PLACEMENT (IOC)  RIGHT TORIC LENS;  Surgeon: Mittie Gaskin, MD;  Location: [facility];  Service: Ophthalmology;  Laterality: Right;   CATARACT EXTRACTION W/PHACO Left 06/13/2019   Procedure: CATARACT EXTRACTION PHACO AND INTRAOCULAR LENS PLACEMENT (IOC) LEFT TORIC LENS;  Surgeon: Mittie Gaskin, MD;  Location: [facility];  Service: Ophthalmology;  Laterality: Left;   Family History  Problem Relation Age of Onset   Cancer Mother    Breast cancer Mother 53   Heart disease Father    Heart attack Father 56   Cancer Maternal Aunt    Breast cancer Maternal Aunt        ? age   Breast cancer Paternal Aunt    Social History   Socioeconomic History   Marital status: Married    Spouse name: Not on file   Number of children: Not on file   Years of education: Not on file   Highest education level: Bachelor's degree (e.g., BA, AB, BS)  Occupational History   Not on file  Tobacco Use   Smoking status: Former    Current packs/day: 0.00    Types: Cigarettes    Start date: 12/20/1968    Quit date: 12/20/1993    Years since quitting: 30.5   Smokeless tobacco: Never  Vaping Use   Vaping status: Never Used  Substance and Sexual Activity   Alcohol use: Yes    Comment: occasional   Drug use: No   Sexual activity: Not on file  Other Topics Concern   Not on file  Social History Narrative   Lives  in [place] with husband.      Diet - healthy diet   Exercise - [employer] weight training   Social Drivers of Health   Financial Resource Strain: Low Risk  (06/20/2024)   Overall Financial Resource Strain (CARDIA)    Difficulty of Paying Living Expenses: Not very hard  Food Insecurity: No Food Insecurity (06/20/2024)   Hunger Vital Sign    Worried About Running Out of Food in the Last Year: Never true    Ran Out of Food in the Last Year: Never true  Transportation Needs: No Transportation Needs (06/20/2024)   PRAPARE - Administrator, Civil Service (Medical): No    Lack of Transportation (Non-Medical): No  Physical Activity:  Insufficiently Active (06/20/2024)   Exercise Vital Sign    Days of Exercise per Week: 2 days    Minutes of Exercise per Session: 40 min  Stress: Stress Concern Present (06/20/2024)   [employer] of Occupational Health - Occupational Stress Questionnaire    Feeling of Stress: To some extent  Social Connections: Socially Integrated (06/20/2024)   Social Connection and Isolation Panel    Frequency of Communication with Friends and Family: Twice a week    Frequency of Social Gatherings with Friends and Family: Once a week    Attends Religious Services: More than 4 times per year    Active Member of [employer] or Organizations: Yes    Attends Engineer, structural: More than 4 times per year    Marital Status: Married    Tobacco Counseling Counseling given: Not Answered    Clinical Intake:  Pre-visit preparation completed: Yes  Pain : 0-10 Pain Score: 3  Pain Type: Chronic pain Pain Location: Shoulder Pain Orientation: Right Pain Descriptors / Indicators: Nagging Pain Onset: More than a month ago Pain Frequency: Intermittent     BMI - recorded: 29.81 Nutritional Status: BMI 25 -29 Overweight Nutritional Risks: None Diabetes: No  Lab Results  Component Value Date   HGBA1C 6.0 09/05/2023   HGBA1C 6.2 (H) 03/04/2023   HGBA1C 6.1  08/16/2022     How often do you need to have someone help you when you read instructions, pamphlets, or other written materials from your doctor or pharmacy?: 1 - Never  Interpreter Needed?: No  Information entered by :: R. Uriel Horkey LPN   Activities of Daily Living     06/20/2024    1:09 PM  In your present state of health, do you have any difficulty performing the following activities:  Hearing? 1  Vision? 0  Comment glasses  Difficulty concentrating or making decisions? 1  Walking or climbing stairs? 0  Dressing or bathing? 0  Doing errands, shopping? 0  Preparing Food and eating ? N  Using the Toilet? N  In the past six months, have you accidently leaked urine? Y  Do you have problems with loss of bowel control? Y  Managing your Medications? N  Managing your Finances? N  Housekeeping or managing your Housekeeping? N    Patient Care Team: Hope Merle, MD as PCP - General (Family Medicine)  I have updated your Care Teams any recent Medical Services you may have received from other providers in the past year.     Assessment:   This is a routine wellness examination for [place].  Hearing/Vision screen Hearing Screening - Comments:: Some difficuly Vision Screening - Comments:: glasses   Goals Addressed             This Visit's Progress    Patient Stated       Wants to walk more       Depression Screen     06/20/2024    1:15 PM 03/05/2024    2:10 PM 10/21/2023   11:17 AM 09/05/2023    9:41 AM 03/04/2023    2:48 PM 11/24/2022    3:15 PM 08/16/2022    9:40 AM  PHQ 2/9 Scores  PHQ - 2 Score 0 0 0 0 0 0 0  PHQ- 9 Score 1 0 0 1 0      Fall Risk     06/20/2024    1:11 PM 03/05/2024    2:10 PM 10/21/2023   11:17 AM 09/05/2023  9:41 AM 03/04/2023    2:48 PM  Fall Risk   Falls in the past year? 0 0 0 0 0  Number falls in past yr: 0 0 0 0 0  Injury with Fall? 0 0 0 0 0  Risk for fall due to : No Fall Risks No Fall Risks No Fall Risks No Fall Risks No Fall Risks   Follow up Falls evaluation completed;Falls prevention discussed Falls evaluation completed Falls evaluation completed Falls evaluation completed Falls evaluation completed    MEDICARE RISK AT HOME:  Medicare Risk at Home Any stairs in or around the home?: Yes If so, are there any without handrails?: No Home free of loose throw rugs in walkways, pet beds, electrical cords, etc?: Yes Adequate lighting in your home to reduce risk of falls?: Yes Life alert?: No Use of a cane, walker or w/c?: No Grab bars in the bathroom?: Yes Shower chair or bench in shower?: No Elevated toilet seat or a handicapped toilet?: Yes  TIMED UP AND GO:  Was the test performed?  No  Cognitive Function: 6CIT completed        06/20/2024    1:19 PM 11/24/2022    3:20 PM 11/07/2019    3:56 PM  6CIT Screen  What Year? 0 points 0 points 0 points  What month? 0 points 0 points 0 points  What time? 0 points  0 points  Count back from 20 0 points  0 points  Months in reverse 0 points 0 points 0 points  Repeat phrase 0 points 0 points 0 points  Total Score 0 points  0 points    Immunizations Immunization History  Administered Date(s) Administered   Fluad Quad(high Dose 65+) 10/04/2019, 10/03/2021   Influenza, High Dose Seasonal PF 10/11/2014, 11/01/2015, 10/27/2016, 10/15/2017, 10/26/2018   Influenza-Unspecified 10/26/2012, 10/29/2013, 10/26/2014, 10/28/2015, 10/15/2020   Moderna Covid-19 Vaccine Bivalent Booster 53yrs & up 10/03/2022   PFIZER(Purple Top)SARS-COV-2 Vaccination 01/11/2020, 02/01/2020, 10/01/2020   Pfizer Covid-19 Vaccine Bivalent Booster 5y-11y 09/19/2021   Pneumococcal Conjugate-13 05/23/2014   Pneumococcal Polysaccharide-23 07/06/2011   Tdap 04/06/2011, 01/08/2021   Unspecified SARS-COV-2 Vaccination 09/29/2023   Zoster Recombinant(Shingrix) 03/20/2020, 07/27/2020   Zoster, Live 07/06/2011    Screening Tests Health Maintenance  Topic Date Due   Medicare Annual Wellness (AWV)   11/25/2023   COVID-19 Vaccine (7 - 2024-25 season) 03/29/2024   INFLUENZA VACCINE  07/20/2024   DTaP/Tdap/Td (3 - Td or Tdap) 01/08/2031   Pneumococcal Vaccine: 50+ Years  Completed   DEXA SCAN  Completed   Zoster Vaccines- Shingrix  Completed   Hepatitis B Vaccines  Aged Out   HPV VACCINES  Aged Out   Meningococcal B Vaccine  Aged Out   Hepatitis C Screening  Discontinued    Health Maintenance  Health Maintenance Due  Topic Date Due   Medicare Annual Wellness (AWV)  11/25/2023   COVID-19 Vaccine (7 - 2024-25 season) 03/29/2024   Health Maintenance Items Addressed: Patient stated that she had her mammogram . Discussed the need to keep vaccines up to date.  Additional Screening:  Vision Screening: Recommended annual ophthalmology exams for early detection of glaucoma and other disorders of the eye.Up to date [facility] Would you like a referral to an eye doctor? No    Dental Screening: Recommended annual dental exams for proper oral hygiene  Community Resource Referral / Chronic Care Management: CRR required this visit?  No   CCM required this visit?  No   Plan:  I have personally reviewed and noted the following in the patient's chart:   Medical and social history Use of alcohol, tobacco or illicit drugs  Current medications and supplements including opioid prescriptions. Patient is not currently taking opioid prescriptions. Functional ability and status Nutritional status Physical activity Advanced directives List of other physicians Hospitalizations, surgeries, and ER visits in previous 12 months Vitals Screenings to include cognitive, depression, and falls Referrals and appointments  In addition, I have reviewed and discussed with patient certain preventive protocols, quality metrics, and best practice recommendations. A written personalized care plan for preventive services as well as general preventive health recommendations were provided to  patient.   Angeline Fredericks, LPN   01/26/7973   After Visit Summary: (MyChart) Due to this being a telephonic visit, the after visit summary with patients personalized plan was offered to patient via MyChart   Notes: Nothing significant to report at this time.

## 2024-07-13 ENCOUNTER — Telehealth: Payer: Self-pay

## 2024-07-13 ENCOUNTER — Other Ambulatory Visit (HOSPITAL_COMMUNITY): Payer: Self-pay

## 2024-07-13 NOTE — Telephone Encounter (Signed)
 Prolia  VOB initiated via [email or web address]  Next Prolia  inj DUE: 08/14/24

## 2024-07-13 NOTE — Telephone Encounter (Signed)
 SABRA

## 2024-07-13 NOTE — Telephone Encounter (Signed)
 Pt ready for scheduling for PROLIA  on or after : 08/14/24  Option# 1: Buy/Bill (Office supplied medication)  Out-of-pocket cost due at time of clinic visit: $372  Number of injection/visits approved: 2  Primary: UHC-MEDICARE Prolia  co-insurance: 20% Admin fee co-insurance: $40  Secondary: --- Prolia  co-insurance:  Admin fee co-insurance:   Medical Benefit Details: Date Benefits were checked: 07/13/24 Deductible: NO/ Coinsurance: 20%/ Admin Fee: $40  Prior Auth: APPROVED PA# [id number] Expiration Date: 08/14/24-08/14/25  # of doses approved: 2 ----------------------------------------------------------------------- Option# 2- Med Obtained from pharmacy:  Pharmacy benefit: Copay [phone] (Paid to pharmacy) Admin Fee: $40 (Pay at clinic)  Prior Auth: N/A PA# Expiration Date:   # of doses approved:   If patient wants fill through the pharmacy benefit please send prescription to: [facility], and include estimated need by date in rx notes. Pharmacy will ship medication directly to the office.  Patient NOT eligible for Prolia  Copay Card. Copay Card can make patient's cost as little as $25. Link to apply: [email or web address]  ** This summary of benefits is an estimation of the patient's out-of-pocket cost. Exact cost may very based on individual plan coverage.

## 2024-09-03 ENCOUNTER — Encounter: Payer: Self-pay | Admitting: *Deleted

## 2024-09-12 ENCOUNTER — Ambulatory Visit

## 2024-09-12 VITALS — BP 112/66 | HR 90 | Temp 98.4°F | Ht 62.0 in | Wt 164.8 lb

## 2024-09-12 DIAGNOSIS — E669 Obesity, unspecified: Secondary | ICD-10-CM

## 2024-09-12 DIAGNOSIS — I10 Essential (primary) hypertension: Secondary | ICD-10-CM

## 2024-09-12 DIAGNOSIS — E782 Mixed hyperlipidemia: Secondary | ICD-10-CM | POA: Diagnosis not present

## 2024-09-12 DIAGNOSIS — R0683 Snoring: Secondary | ICD-10-CM | POA: Insufficient documentation

## 2024-09-12 DIAGNOSIS — G8929 Other chronic pain: Secondary | ICD-10-CM

## 2024-09-12 DIAGNOSIS — R7303 Prediabetes: Secondary | ICD-10-CM

## 2024-09-12 DIAGNOSIS — M25511 Pain in right shoulder: Secondary | ICD-10-CM

## 2024-09-12 DIAGNOSIS — F39 Unspecified mood [affective] disorder: Secondary | ICD-10-CM

## 2024-09-12 DIAGNOSIS — R42 Dizziness and giddiness: Secondary | ICD-10-CM

## 2024-09-12 DIAGNOSIS — M810 Age-related osteoporosis without current pathological fracture: Secondary | ICD-10-CM

## 2024-09-12 DIAGNOSIS — R251 Tremor, unspecified: Secondary | ICD-10-CM

## 2024-09-12 DIAGNOSIS — E785 Hyperlipidemia, unspecified: Secondary | ICD-10-CM

## 2024-09-12 MED ORDER — LOSARTAN POTASSIUM 100 MG PO TABS: 100.0000 mg | ORAL_TABLET | Freq: Every day | ORAL | 1 refills | Status: AC

## 2024-09-12 MED ORDER — SIMVASTATIN 20 MG PO TABS: 20.0000 mg | ORAL_TABLET | Freq: Every day | ORAL | 1 refills | Status: AC

## 2024-09-12 MED ORDER — VENLAFAXINE HCL ER 150 MG PO CP24: 150.0000 mg | ORAL_CAPSULE | Freq: Every day | ORAL | 1 refills | Status: AC

## 2024-09-12 NOTE — Assessment & Plan Note (Signed)
 On Prolia  twice a year, well-tolerated. Check vitamin D  levels. Continue Prolia  and repeat DEXA after next visit in 6 months.

## 2024-09-12 NOTE — Assessment & Plan Note (Signed)
 Chronic with flare up with repetitive motion, exam reassuring. Consider using Voltaren gel 1%, 4 times a day as needed for pain. Avoid triggering activities.  Consider physical therapy or imaging if pain persists.

## 2024-09-12 NOTE — Assessment & Plan Note (Signed)
 Check vitamin D .  Diet: Emphasize whole grains, lean proteins, fruits, and vegetables. Limit processed foods and sugary drinks. Exercise: Aim for 150 minutes of moderate aerobic activity weekly plus strength training twice a week.

## 2024-09-12 NOTE — Assessment & Plan Note (Signed)
 Check A1c. Discussed precautions including diet, exercise to reduce risk of progression to diabetes as follows:  Diet: Emphasize whole grains, lean proteins, fruits, and vegetables. Limit processed foods and sugary drinks. Exercise: Aim for 150 minutes of moderate aerobic activity weekly plus strength training twice a week.

## 2024-09-12 NOTE — Progress Notes (Signed)
 Established Patient Office Visit TOC from Dr. Eual   Subjective  Patient ID: Stephanie Mora, female    DOB: 08/27/1941  Age: 83 y.o. MRN: [id number]  Chief Complaint  Patient presents with   Establish Care   Shoulder Pain    She  has a past medical history of Bilateral dry eyes, Breast cancer (HCC) (2000), Heart murmur, Hyperlipidemia, Lightheadedness (08/16/2022), Osteoporosis, and Personal history of radiation therapy (2000).  HPI Discussed the use of AI scribe software for clinical note transcription with the patient, who gave verbal consent to proceed.  History of Present Illness Stephanie Mora is an 83 year old female who presents for medication refill and evaluation of shoulder pain and sleep disturbances.  She requires a refill for venlafaxine , which she takes at a dose of 150 mg daily. She has been on this medication since 2000 following a lumpectomy and finds it effective for her mood. She also takes simvastatin  and has been on Prolia  for approximately two years, with her last injection in the summer. Her last DEXA scan was in October 2024 at the [facility].   She has a history of prediabetes, with her last A1c in September 2024 being in the prediabetic range. She tries to maintain her weight below 165 pounds and exercises twice a week with Silver Sneakers. She acknowledges she could improve her diet and physical activity.  She experiences right shoulder pain, which she manages by reducing activity and believes may be related to prior bursitis or arthritis. The pain is located on the top of the shoulder and she manages it by reducing activity. She does not frequently use Advil or other medications for the pain.  She reports sleep disturbances, including difficulty falling asleep and waking frequently during the night. She does not get up to urinate at night and does not feel refreshed upon waking. She has been told by a friend that she snores, although her  husband does not complain about it. She goes to bed around 12:30 AM and wakes up around 6:00 AM, often feeling fatigued during the day.  She has noticed hand tremors, particularly when holding objects like a paper plate or a mirror. The tremors are more pronounced in her right hand. Her husband has essential tremors.    ROS As per HPI    Objective:     BP 112/66 (BP Location: Right Arm, Patient Position: Sitting, Cuff Size: Normal)   Pulse 90   Temp 98.4 F (36.9 C) (Oral)   Ht 5' 2 (1.575 m)   Wt 164 lb 12.8 oz (74.8 kg)   SpO2 97%   BMI 30.14 kg/m      09/12/2024    3:12 PM 06/20/2024    1:15 PM 03/05/2024    2:10 PM  Depression screen PHQ 2/9  Decreased Interest 0 0 0  Down, Depressed, Hopeless 0 0 0  PHQ - 2 Score 0 0 0  Altered sleeping 2 1 0  Tired, decreased energy 0 0 0  Change in appetite 0 0 0  Feeling bad or failure about yourself  0 0 0  Trouble concentrating 0 0 0  Moving slowly or fidgety/restless 0 0 0  Suicidal thoughts 0 0 0  PHQ-9 Score 2 1 0  Difficult doing work/chores Not difficult at all Not difficult at all Not difficult at all      09/12/2024    3:12 PM 03/05/2024    2:10 PM 10/21/2023   11:17 AM 09/05/2023  9:42 AM  GAD 7 : Generalized Anxiety Score  Nervous, Anxious, on Edge 0 0 0 0  Control/stop worrying 0 0 0 0  Worry too much - different things 0 0 0 0  Trouble relaxing 0 0 0 0  Restless 0 0 0 0  Easily annoyed or irritable 0 0 0 0  Afraid - awful might happen 0 0 0 0  Total GAD 7 Score 0 0 0 0  Anxiety Difficulty Not difficult at all Not difficult at all Not difficult at all Not difficult at all      09/12/2024    3:12 PM 06/20/2024    1:15 PM 03/05/2024    2:10 PM  Depression screen PHQ 2/9  Decreased Interest 0 0 0  Down, Depressed, Hopeless 0 0 0  PHQ - 2 Score 0 0 0  Altered sleeping 2 1 0  Tired, decreased energy 0 0 0  Change in appetite 0 0 0  Feeling bad or failure about yourself  0 0 0  Trouble concentrating 0 0 0   Moving slowly or fidgety/restless 0 0 0  Suicidal thoughts 0 0 0  PHQ-9 Score 2 1 0  Difficult doing work/chores Not difficult at all Not difficult at all Not difficult at all      09/12/2024    3:12 PM 03/05/2024    2:10 PM 10/21/2023   11:17 AM 09/05/2023    9:42 AM  GAD 7 : Generalized Anxiety Score  Nervous, Anxious, on Edge 0 0 0 0  Control/stop worrying 0 0 0 0  Worry too much - different things 0 0 0 0  Trouble relaxing 0 0 0 0  Restless 0 0 0 0  Easily annoyed or irritable 0 0 0 0  Afraid - awful might happen 0 0 0 0  Total GAD 7 Score 0 0 0 0  Anxiety Difficulty Not difficult at all Not difficult at all Not difficult at all Not difficult at all   SDOH Screenings   Food Insecurity: No Food Insecurity (06/20/2024)  Housing: Low Risk  (06/20/2024)  Transportation Needs: No Transportation Needs (06/20/2024)  Utilities: Not At Risk (06/20/2024)  Alcohol Screen: Low Risk  (06/20/2024)  Depression (PHQ2-9): Low Risk  (09/12/2024)  Financial Resource Strain: Low Risk  (06/20/2024)  Physical Activity: Insufficiently Active (06/20/2024)  Social Connections: Socially Integrated (06/20/2024)  Stress: Stress Concern Present (06/20/2024)  Tobacco Use: Medium Risk (09/12/2024)  Health Literacy: Adequate Health Literacy (06/20/2024)     Physical Exam HENT:     Head: Normocephalic and atraumatic.     Mouth/Throat:     Mouth: Mucous membranes are moist.  Cardiovascular:     Rate and Rhythm: Normal rate.  Pulmonary:     Effort: Pulmonary effort is normal.     Breath sounds: Normal breath sounds.  Abdominal:     Palpations: Abdomen is soft.     Tenderness: There is no guarding.  Musculoskeletal:     Cervical back: Normal range of motion and neck supple. No rigidity.     Right lower leg: No edema.     Left lower leg: No edema.  Neurological:     Mental Status: She is alert and oriented to person, place, and time.     Comments: B/l mild hand tremor when outstretched hands.  Psychiatric:         Mood and Affect: Mood normal.        No results found for any visits on 09/12/24.  The ASCVD Risk score (Arnett DK, et al., 2019) failed to calculate for the following reasons:   The 2019 ASCVD risk score is only valid for ages 72 to 5     Assessment & Plan:   Mixed hyperlipidemia Assessment & Plan: On simvastatin  20 mg daily, tolerating well, continue. Reviewed lipid panel from 03/2024, repeat fasting lipid in 6 months follow up.   Orders: -     Simvastatin ; Take 1 tablet (20 mg total) by mouth daily at 6 PM.  Dispense: 90 tablet; Refill: 1 -     Lipid panel; Future  Mood disorder Assessment & Plan: On Effexor  150 mg daily, no SI/HI, continue, refill sent.   Orders: -     Venlafaxine  HCl ER; Take 1 capsule (150 mg total) by mouth daily.  Dispense: 90 capsule; Refill: 1  Pre-diabetes Assessment & Plan: Check A1c. Discussed precautions including diet, exercise to reduce risk of progression to diabetes as follows:  Diet: Emphasize whole grains, lean proteins, fruits, and vegetables. Limit processed foods and sugary drinks. Exercise: Aim for 150 minutes of moderate aerobic activity weekly plus strength training twice a week.  Orders: -     Hemoglobin A1c -     Hemoglobin A1c; Future  Primary hypertension Assessment & Plan: BP within control, continue Losartan  100 mg daily. Check CMP during next visit.   Orders: -     Losartan  Potassium; Take 1 tablet (100 mg total) by mouth daily.  Dispense: 90 tablet; Refill: 1 -     Comprehensive metabolic panel with GFR; Future  Snoring Assessment & Plan: Snoring with difficulty falling back asleep. Suspicious for OSA, sleep clinic referral made for evaluation.  Try Melatonin 1 mg an hour before bedtime to help with sleep.   Orders: -     Pulmonary Visit  Obesity (BMI 30-39.9) Assessment & Plan: Check vitamin D .  Diet: Emphasize whole grains, lean proteins, fruits, and vegetables. Limit processed foods and sugary  drinks. Exercise: Aim for 150 minutes of moderate aerobic activity weekly plus strength training twice a week.  Orders: -     VITAMIN D  25 Hydroxy (Vit-D Deficiency, Fractures)  Chronic right shoulder pain Assessment & Plan: Chronic with flare up with repetitive motion, exam reassuring. Consider using Voltaren gel 1%, 4 times a day as needed for pain. Avoid triggering activities.  Consider physical therapy or imaging if pain persists.   Age-related osteoporosis without current pathological fracture Assessment & Plan: On Prolia  twice a year, well-tolerated. Check vitamin D  levels. Continue Prolia  and repeat DEXA after next visit in 6 months.     Tremor Assessment & Plan: Chronic, unchanged. B/L hand tremors (left slightly worse than right) mostly with action suggestive of essential tremorChronic issue.  She will monitor this.      Return in about 6 months (around 03/12/2025) for chronic follow up, fasting labs 2 days before apt.   Luke Shade, MD

## 2024-09-12 NOTE — Assessment & Plan Note (Signed)
 On simvastatin  20 mg daily, tolerating well, continue. Reviewed lipid panel from 03/2024, repeat fasting lipid in 6 months follow up.

## 2024-09-12 NOTE — Assessment & Plan Note (Signed)
 Chronic, unchanged. B/L hand tremors (left slightly worse than right) mostly with action suggestive of essential tremorChronic issue.  She will monitor this.

## 2024-09-12 NOTE — Assessment & Plan Note (Signed)
 On Effexor  150 mg daily, no SI/HI, continue, refill sent.

## 2024-09-12 NOTE — Assessment & Plan Note (Signed)
 Snoring with difficulty falling back asleep. Suspicious for OSA, sleep clinic referral made for evaluation.  Try Melatonin 1 mg an hour before bedtime to help with sleep.

## 2024-09-12 NOTE — Assessment & Plan Note (Signed)
 BP within control, continue Losartan  100 mg daily. Check CMP during next visit.

## 2024-09-12 NOTE — Patient Instructions (Addendum)
 For prediabetes:  Diet: Emphasize whole grains, lean proteins, fruits, and vegetables. Limit processed foods and sugary drinks. Exercise: Aim for 150 minutes of moderate aerobic activity weekly plus strength training twice a week.   Please update your COVID-19, influenza through your local pharmacy.    I am checking your A1c today and will update you once I have the result.   For right shoulder pain:  If avoiding triggering activities does not help with pain, try Voltaren gel 1%, up to 4 times a day as needed. Ice/heat can be helpful as well.    I am referring you to Dr. Jess for potential sleep apnea evaluation. You can try Melatonin 1 mg, an hour before you go to bed to help with sleep.

## 2024-09-13 LAB — HEMOGLOBIN A1C: Hgb A1c MFr Bld: 6.3 % (ref 4.6–6.5)

## 2024-09-13 LAB — VITAMIN D 25 HYDROXY (VIT D DEFICIENCY, FRACTURES): VITD: 32.5 ng/mL (ref 30.00–100.00)

## 2024-09-14 ENCOUNTER — Ambulatory Visit: Payer: Self-pay

## 2024-09-24 ENCOUNTER — Telehealth: Payer: Self-pay | Admitting: *Deleted

## 2024-09-24 NOTE — Telephone Encounter (Unsigned)
 Patient was seen on 9/24 for an OV with PCP. There was a note to also give Prolia  during visit.   Please advise if there is a reason that pt did not receive her injection during that time.   Pt owes $372 for injection & it looks like payment & injection was both missed.

## 2024-09-27 NOTE — Telephone Encounter (Signed)
 Last Prolia  was on 02/15/24. She is due for Prolia  injection as of 08/14/2024. Missed dose increase risk of osteoporosis related fracture. Please call patient to recommend nurse visit for Prolia  injection at her earliest convenience. Normal vitamin D : 09/12/24, normal calcium 04/09/24.  Luke Shade, MD

## 2024-09-27 NOTE — Telephone Encounter (Signed)
 Noted

## 2024-10-01 ENCOUNTER — Encounter

## 2024-10-03 ENCOUNTER — Ambulatory Visit (INDEPENDENT_AMBULATORY_CARE_PROVIDER_SITE_OTHER)

## 2024-10-03 DIAGNOSIS — M810 Age-related osteoporosis without current pathological fracture: Secondary | ICD-10-CM | POA: Diagnosis not present

## 2024-10-03 MED ORDER — DENOSUMAB 60 MG/ML ~~LOC~~ SOSY: 60.0000 mg | PREFILLED_SYRINGE | Freq: Once | SUBCUTANEOUS | Status: AC

## 2024-10-03 NOTE — Telephone Encounter (Signed)
 Patient is scheduled for Prolia  injection today at 4pm.

## 2024-10-03 NOTE — Progress Notes (Signed)
 Pt received Prolia  injection in Left arm subcutaneous. Pt tolerated it well with no complaints or concerns.

## 2024-10-09 ENCOUNTER — Ambulatory Visit (INDEPENDENT_AMBULATORY_CARE_PROVIDER_SITE_OTHER): Admitting: Sleep Medicine

## 2024-10-09 ENCOUNTER — Encounter: Payer: Self-pay | Admitting: Sleep Medicine

## 2024-10-09 VITALS — BP 120/80 | HR 83 | Temp 97.9°F | Ht 62.0 in | Wt 165.6 lb

## 2024-10-09 DIAGNOSIS — G4733 Obstructive sleep apnea (adult) (pediatric): Secondary | ICD-10-CM | POA: Diagnosis not present

## 2024-10-09 DIAGNOSIS — I10 Essential (primary) hypertension: Secondary | ICD-10-CM

## 2024-10-09 NOTE — Progress Notes (Signed)
 Name:Stephanie Mora MRN: [id number] DOB: 09/23/1941   CHIEF COMPLAINT:  SNORING   HISTORY OF PRESENT ILLNESS: Stephanie Mora is a 83 y.o. w/ a h/o HTN, anxiety, depression, hyperlipidemia and obesity who present for c/o loud snoring and occasional daytime sleepiness which has been present for several years. Reports occasional nocturnal awakenings. Denies any significant weight changes. Denies morning headaches, RLS symptoms, dream enactment, cataplexy, hypnagogic or hypnapompic hallucinations. Denies a family history of sleep apnea. Denies drowsy driving. Drinks 1 cup of coffee daily, occasional alcohol use, denies tobacco or illicit drug use.   Bedtime 12:15 pm Sleep onset 30 mins Rise time 8 am   EPWORTH SLEEP SCORE 9    10/09/2024    2:00 PM  Results of the Epworth flowsheet  Sitting and reading 2  Watching TV 0  Sitting, inactive in a public place (e.g. a theatre or a meeting) 2  As a passenger in a car for an hour without a break 2  Lying down to rest in the afternoon when circumstances permit 2  Sitting and talking to someone 0  Sitting quietly after a lunch without alcohol 1  In a car, while stopped for a few minutes in traffic 0  Total score 9    PAST MEDICAL HISTORY :   has a past medical history of Allergy, Arthritis, Bilateral dry eyes, Breast cancer (HCC) (2000), GERD (gastroesophageal reflux disease), Heart murmur, Hyperlipidemia, Hypertension, Lightheadedness (08/16/2022), Osteoporosis, and Personal history of radiation therapy (2000).  has a past surgical history that includes Breast lumpectomy (Right, 2000); Cataract extraction w/PHACO (Right, 05/23/2019); Cataract extraction w/PHACO (Left, 06/13/2019); Breast biopsy (Right, 2000); Breast biopsy (Left, 1980's); and Eye surgery (cataracts). Prior to Admission medications   Medication Sig Start Date End Date Taking? Authorizing Provider  CALTRATE 600+D3 SOFT 600-20 MG-MCG CHEW  01/25/22  Yes [provider]  Cholecalciferol (VITAMIN D3) 2000 UNITS TABS Take 1 tablet by mouth daily.   Yes [provider]  Coenzyme Q10 (COQ10) 100 MG CAPS Take 1 capsule by mouth daily.   Yes [provider]  losartan  (COZAAR ) 100 MG tablet Take 1 tablet (100 mg total) by mouth daily. 09/12/24  Yes Bair, Kalpana, MD  MISC NATURAL PRODUCT OP Take by mouth. Tumeric 500mg    Yes [provider]  Multiple Vitamin (MULTIVITAMIN) tablet Take 1 tablet by mouth daily.   Yes [provider]  Polyethyl Glycol-Propyl Glycol (SYSTANE) 0.4-0.3 % SOLN Apply to eye.   Yes [provider]  PROLIA  60 MG/ML SOSY injection  01/25/22  Yes [provider]  simvastatin  (ZOCOR ) 20 MG tablet Take 1 tablet (20 mg total) by mouth daily at 6 PM. 09/12/24  Yes Bair, Kalpana, MD  venlafaxine  XR (EFFEXOR -XR) 150 MG 24 hr capsule Take 1 capsule (150 mg total) by mouth daily. 09/12/24  Yes Bair, Kalpana, MD  vitamin C (ASCORBIC ACID) 500 MG tablet Take 500 mg by mouth 2 (two) times daily.   Yes [provider]   No Known Allergies  FAMILY HISTORY:  family history includes Breast cancer in her maternal aunt and paternal aunt; Breast cancer (age of onset: 61) in her mother; Cancer in her maternal aunt and mother; Heart attack (age of onset: 51) in her father; Heart disease in her father. SOCIAL HISTORY:  reports that she quit smoking about 30 years ago. Her smoking use included cigarettes. She started smoking about 55 years ago. She has never used smokeless tobacco. She reports  current alcohol use. She reports that she does not use drugs.   Review of Systems:  Gen:  Denies  fever, sweats, chills weight loss  HEENT: Denies blurred vision, double vision, ear pain, eye pain, hearing loss, nose bleeds, sore throat Cardiac:  No dizziness, chest pain or heaviness, chest tightness,edema, No JVD Resp:   No cough, -sputum production, -shortness of breath,-wheezing, -hemoptysis,  Gi: Denies  swallowing difficulty, stomach pain, nausea or vomiting, diarrhea, constipation, bowel incontinence Gu:  Denies bladder incontinence, burning urine Ext:   Denies Joint pain, stiffness or swelling Skin: Denies  skin rash, easy bruising or bleeding or hives Endoc:  Denies polyuria, polydipsia , polyphagia or weight change Psych:   Denies depression, insomnia or hallucinations  Other:  All other systems negative  VITAL SIGNS: BP 120/80   Pulse 83   Temp 97.9 F (36.6 C)   Ht 5' 2 (1.575 m)   Wt 165 lb 9.6 oz (75.1 kg)   SpO2 96%   BMI 30.29 kg/m    Physical Examination:   General Appearance: No distress  EYES PERRLA, EOM intact.   NECK Supple, No JVD Pulmonary: normal breath sounds, No wheezing.  CardiovascularNormal S1,S2.  No m/r/g.   Abdomen: Benign, Soft, non-tender. Skin:   warm, no rashes, no ecchymosis  Extremities: normal, no cyanosis, clubbing. Neuro:without focal findings,  speech normal  PSYCHIATRIC: Mood, affect within normal limits.   ASSESSMENT AND PLAN  OSA I suspect that OSA is likely present due to clinical presentation. Discussed the consequences of untreated sleep apnea. Advised not to drive drowsy for safety of patient and others. Will complete further evaluation with a home sleep study and follow up to review results.    HTN Stable, on current management. Following with PCP.    MEDICATION ADJUSTMENTS/LABS AND TESTS ORDERED: Recommend Sleep Study   Patient  satisfied with Plan of action and management. All questions answered  Follow up to review HST results and treatment plan.   I spent a total of 38 minutes reviewing chart data, face-to-face evaluation with the patient, counseling and coordination of care as detailed above.    Stephanie Mora, M.D.  Sleep Medicine [facility]

## 2024-10-09 NOTE — Patient Instructions (Addendum)
 SABRA

## 2024-11-10 ENCOUNTER — Encounter

## 2024-11-10 DIAGNOSIS — G4733 Obstructive sleep apnea (adult) (pediatric): Secondary | ICD-10-CM

## 2024-11-20 DIAGNOSIS — R0683 Snoring: Secondary | ICD-10-CM

## 2024-11-20 DIAGNOSIS — G4733 Obstructive sleep apnea (adult) (pediatric): Secondary | ICD-10-CM | POA: Diagnosis not present

## 2024-11-22 ENCOUNTER — Ambulatory Visit: Payer: Self-pay

## 2024-11-29 ENCOUNTER — Ambulatory Visit: Admitting: Sleep Medicine

## 2024-11-29 ENCOUNTER — Encounter: Payer: Self-pay | Admitting: Sleep Medicine

## 2024-11-29 VITALS — BP 136/80 | HR 87 | Temp 98.4°F | Ht 62.0 in | Wt 166.0 lb

## 2024-11-29 DIAGNOSIS — I10 Essential (primary) hypertension: Secondary | ICD-10-CM | POA: Diagnosis not present

## 2024-11-29 DIAGNOSIS — Z87891 Personal history of nicotine dependence: Secondary | ICD-10-CM

## 2024-11-29 DIAGNOSIS — G4733 Obstructive sleep apnea (adult) (pediatric): Secondary | ICD-10-CM

## 2024-11-29 NOTE — Progress Notes (Signed)
 Name:Stephanie Mora MRN: [id number] DOB: 12/09/1941   CHIEF COMPLAINT:  HST F/U   HISTORY OF PRESENT ILLNESS: Stephanie Mora is a 83 y.o. w/ a h/o HTN, anxiety, depression, hyperlipidemia and obesity who presents to follow up on HST results. The patient underwent HST which revealed moderate OSA (AHI 18, O2 nadir 84%).    EPWORTH SLEEP SCORE 9    10/09/2024    2:00 PM  Results of the Epworth flowsheet  Sitting and reading 2  Watching TV 0  Sitting, inactive in a public place (e.g. a theatre or a meeting) 2  As a passenger in a car for an hour without a break 2  Lying down to rest in the afternoon when circumstances permit 2  Sitting and talking to someone 0  Sitting quietly after a lunch without alcohol 1  In a car, while stopped for a few minutes in traffic 0  Total score 9    PAST MEDICAL HISTORY :   has a past medical history of Allergy, Arthritis, Bilateral dry eyes, Breast cancer (HCC) (2000), GERD (gastroesophageal reflux disease), Heart murmur, Hyperlipidemia, Hypertension, Lightheadedness (08/16/2022), Osteoporosis, and Personal history of radiation therapy (2000).  has a past surgical history that includes Breast lumpectomy (Right, 2000); Cataract extraction w/PHACO (Right, 05/23/2019); Cataract extraction w/PHACO (Left, 06/13/2019); Breast biopsy (Right, 2000); Breast biopsy (Left, 1980's); and Eye surgery (cataracts). Prior to Admission medications   Medication Sig Start Date End Date Taking? Authorizing Provider  CALTRATE 600+D3 SOFT 600-20 MG-MCG CHEW  01/25/22  Yes [provider]  Cholecalciferol (VITAMIN D3) 2000 UNITS TABS Take 1 tablet by mouth daily.   Yes [provider]  Coenzyme Q10 (COQ10) 100 MG CAPS Take 1 capsule by mouth daily.   Yes [provider]  losartan  (COZAAR ) 100 MG tablet Take 1 tablet (100 mg total) by mouth daily. 09/12/24  Yes Bair, Kalpana, MD  MISC NATURAL PRODUCT OP Take by mouth. Tumeric 500mg    Yes [provider]  Multiple Vitamin (MULTIVITAMIN) tablet Take 1 tablet by mouth daily.   Yes [provider]  Polyethyl Glycol-Propyl Glycol (SYSTANE) 0.4-0.3 % SOLN Apply to eye.   Yes [provider]  PROLIA  60 MG/ML SOSY injection  01/25/22  Yes [provider]  simvastatin  (ZOCOR ) 20 MG tablet Take 1 tablet (20 mg total) by mouth daily at 6 PM. 09/12/24  Yes Bair, Kalpana, MD  venlafaxine  XR (EFFEXOR -XR) 150 MG 24 hr capsule Take 1 capsule (150 mg total) by mouth daily. 09/12/24  Yes Bair, Kalpana, MD  vitamin C (ASCORBIC ACID) 500 MG tablet Take 500 mg by mouth 2 (two) times daily.   Yes [provider]   No Known Allergies  FAMILY HISTORY:  family history includes Breast cancer in her maternal aunt and paternal aunt; Breast cancer (age of onset: 24) in her mother; Cancer in her maternal aunt and mother; Heart attack (age of onset: 68) in her father; Heart disease in her father. SOCIAL HISTORY:  reports that she quit smoking about 30 years ago. Her smoking use included cigarettes. She started smoking about 55 years ago. She has never used smokeless tobacco. She reports current alcohol use. She reports that she does not use drugs.   Review of Systems:  Gen:  Denies  fever, sweats, chills weight loss  HEENT: Denies blurred vision, double vision, ear pain, eye pain, hearing loss, nose bleeds, sore throat Cardiac:  No dizziness, chest pain or heaviness, chest tightness,edema,  No JVD Resp:   No cough, -sputum production, -shortness of breath,-wheezing, -hemoptysis,  Gi: Denies swallowing difficulty, stomach pain, nausea or vomiting, diarrhea, constipation, bowel incontinence Gu:  Denies bladder incontinence, burning urine Ext:   Denies Joint pain, stiffness or swelling Skin: Denies  skin rash, easy bruising or bleeding or hives Endoc:  Denies polyuria, polydipsia , polyphagia or weight change Psych:   Denies depression, insomnia or hallucinations  Other:  All  other systems negative  VITAL SIGNS: BP 136/80   Pulse 87   Temp 98.4 F (36.9 C)   Ht 5' 2 (1.575 m)   Wt 166 lb (75.3 kg)   SpO2 95%   BMI 30.36 kg/m    Physical Examination:   General Appearance: No distress  EYES PERRLA, EOM intact.   NECK Supple, No JVD Pulmonary: normal breath sounds, No wheezing.  CardiovascularNormal S1,S2.  No m/r/g.   Abdomen: Benign, Soft, non-tender. Skin:   warm, no rashes, no ecchymosis  Extremities: normal, no cyanosis, clubbing. Neuro:without focal findings,  speech normal  PSYCHIATRIC: Mood, affect within normal limits.   ASSESSMENT AND PLAN  OSA Reviewed HST results with patient. Starting on APAP therapy set to 4-16 cm H2O. Discussed the consequences of untreated sleep apnea. Advised not to drive drowsy for safety of patient and others. Will follow up in 3 months.    HTN Stable, on current management. Following with PCP.    Patient  satisfied with Plan of action and management. All questions answered  I spent a total of 26 minutes reviewing chart data, face-to-face evaluation with the patient, counseling and coordination of care as detailed above.    Stephanie Mora, M.D.  Sleep Medicine [facility]

## 2024-11-29 NOTE — Patient Instructions (Addendum)

## 2025-02-28 ENCOUNTER — Ambulatory Visit: Admitting: Sleep Medicine

## 2025-03-11 ENCOUNTER — Other Ambulatory Visit

## 2025-03-13 ENCOUNTER — Ambulatory Visit

## 2025-06-25 ENCOUNTER — Ambulatory Visit
